# Patient Record
Sex: Female | Born: 1985 | Race: White | Hispanic: No | Marital: Single | Smoking: Never smoker
Health system: Southern US, Community
[De-identification: ages and names within clinical notes are randomized; demographics above are authoritative.]

## PROBLEM LIST (undated history)

## (undated) DIAGNOSIS — G43909 Migraine, unspecified, not intractable, without status migrainosus: Secondary | ICD-10-CM

## (undated) DIAGNOSIS — T7840XA Allergy, unspecified, initial encounter: Secondary | ICD-10-CM

## (undated) HISTORY — DX: Migraine, unspecified, not intractable, without status migrainosus: G43.909

## (undated) HISTORY — PX: DENTAL SURGERY: SHX609

## (undated) HISTORY — PX: KNEE SURGERY: SHX244

## (undated) HISTORY — DX: Allergy, unspecified, initial encounter: T78.40XA

---
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
## 2003-03-11 ENCOUNTER — Emergency Department (HOSPITAL_COMMUNITY): Admission: AD | Admit: 2003-03-11 | Discharge: 2003-03-11 | Payer: Self-pay | Admitting: Family Medicine

## 2003-03-15 ENCOUNTER — Emergency Department (HOSPITAL_COMMUNITY): Admission: AD | Admit: 2003-03-15 | Discharge: 2003-03-15 | Payer: Self-pay | Admitting: Family Medicine

## 2003-03-16 ENCOUNTER — Ambulatory Visit (HOSPITAL_COMMUNITY): Admission: RE | Admit: 2003-03-16 | Discharge: 2003-03-16 | Payer: Self-pay | Admitting: Family Medicine

## 2003-03-21 ENCOUNTER — Emergency Department (HOSPITAL_COMMUNITY): Admission: AD | Admit: 2003-03-21 | Discharge: 2003-03-21 | Payer: Self-pay | Admitting: Family Medicine

## 2003-03-29 ENCOUNTER — Encounter: Admission: RE | Admit: 2003-03-29 | Discharge: 2003-03-29 | Payer: Self-pay | Admitting: Gastroenterology

## 2003-05-15 ENCOUNTER — Emergency Department (HOSPITAL_COMMUNITY): Admission: EM | Admit: 2003-05-15 | Discharge: 2003-05-15 | Payer: Self-pay | Admitting: Emergency Medicine

## 2003-06-21 ENCOUNTER — Emergency Department (HOSPITAL_COMMUNITY): Admission: EM | Admit: 2003-06-21 | Discharge: 2003-06-22 | Payer: Self-pay | Admitting: Emergency Medicine

## 2004-03-24 ENCOUNTER — Emergency Department (HOSPITAL_COMMUNITY): Admission: EM | Admit: 2004-03-24 | Discharge: 2004-03-24 | Payer: Self-pay | Admitting: Family Medicine

## 2005-05-12 ENCOUNTER — Emergency Department (HOSPITAL_COMMUNITY): Admission: AD | Admit: 2005-05-12 | Discharge: 2005-05-12 | Payer: Self-pay | Admitting: Family Medicine

## 2005-06-03 ENCOUNTER — Emergency Department (HOSPITAL_COMMUNITY): Admission: EM | Admit: 2005-06-03 | Discharge: 2005-06-03 | Payer: Self-pay | Admitting: Family Medicine

## 2013-05-20 ENCOUNTER — Ambulatory Visit (INDEPENDENT_AMBULATORY_CARE_PROVIDER_SITE_OTHER): Payer: BC Managed Care – PPO | Admitting: Physician Assistant

## 2013-05-20 VITALS — BP 110/72 | HR 79 | Temp 98.0°F | Resp 18 | Ht 70.0 in | Wt 181.0 lb

## 2013-05-20 DIAGNOSIS — B9789 Other viral agents as the cause of diseases classified elsewhere: Principal | ICD-10-CM

## 2013-05-20 DIAGNOSIS — J069 Acute upper respiratory infection, unspecified: Secondary | ICD-10-CM

## 2013-05-20 MED ORDER — BENZONATATE 100 MG PO CAPS: 100.0000 mg | ORAL_CAPSULE | Freq: Three times a day (TID) | ORAL | Status: DC | PRN

## 2013-05-20 MED ORDER — IPRATROPIUM BROMIDE 0.03 % NA SOLN: 2.0000 | Freq: Two times a day (BID) | NASAL | Status: DC

## 2013-05-20 MED ORDER — HYDROCODONE-HOMATROPINE 5-1.5 MG/5ML PO SYRP: 5.0000 mL | ORAL_SOLUTION | Freq: Three times a day (TID) | ORAL | Status: DC | PRN

## 2013-05-20 MED ORDER — CETIRIZINE HCL 10 MG PO TABS: 10.0000 mg | ORAL_TABLET | Freq: Every day | ORAL | Status: DC

## 2013-05-20 MED ORDER — FIRST-DUKES MOUTHWASH MT SUSP
10.0000 mL | OROMUCOSAL | Status: DC | PRN
Start: 2013-05-20 — End: 2013-06-29

## 2013-05-20 NOTE — Progress Notes (Signed)
   Subjective:    Patient ID: Magdalene RiverEmmalee D Zobrist, female    DOB: 1985-09-05, 28 y.o.   MRN: [id number]  HPI   Ms. Langston MaskerMorris is a very pleasant 28 yr old female here with concern for illness.  Reports began feeling ill 4 days ago.  Symptoms include sore throat, laryngitis, "throat on fire," coughing constantly.  Cough keeps awake at night.  Nose isn't too congested but she has lots of PND.  Ears feel clogged.  Appetite is down.  NO fever.  No SOB or wheezing.  No known sick contacts but was at a conference last week.  Taking Dayquil and Mucinex without relief.     Review of Systems  Constitutional: Positive for appetite change (decreased). Negative for fever and chills.  HENT: Positive for congestion, ear pain (fullness), postnasal drip and sore throat.   Respiratory: Positive for cough. Negative for shortness of breath and wheezing.   Cardiovascular: Negative.   Gastrointestinal: Negative.   Musculoskeletal: Negative.   Skin: Negative.        Objective:   Physical Exam  Vitals reviewed. Constitutional: She is oriented to person, place, and time. She appears well-developed and well-nourished. No distress.  HENT:  Head: Normocephalic and atraumatic.  Right Ear: Tympanic membrane and ear canal normal.  Left Ear: Tympanic membrane and ear canal normal.  Nose: Mucosal edema and rhinorrhea present. Right sinus exhibits no maxillary sinus tenderness and no frontal sinus tenderness. Left sinus exhibits no maxillary sinus tenderness and no frontal sinus tenderness.  Mouth/Throat: Uvula is midline, oropharynx is clear and moist and mucous membranes are normal.  Eyes: Conjunctivae are normal. No scleral icterus.  Neck: Neck supple.  Cardiovascular: Normal rate, regular rhythm and normal heart sounds.   Pulmonary/Chest: Effort normal and breath sounds normal. She has no wheezes. She has no rales.  Abdominal: Soft. There is no tenderness.  Lymphadenopathy:    She has cervical adenopathy.    Neurological: She is alert and oriented to person, place, and time.  Skin: Skin is warm and dry.  Psychiatric: She has a normal mood and affect. Her behavior is normal.       Assessment & Plan:  Viral URI with cough - Plan: Diphenhyd-Hydrocort-Nystatin (FIRST-DUKES MOUTHWASH) SUSP, cetirizine (ZYRTEC) 10 MG tablet, benzonatate (TESSALON) 100 MG capsule, HYDROcodone-homatropine (HYCODAN) 5-1.5 MG/5ML syrup, ipratropium (ATROVENT) 0.03 % nasal spray   Ms. Langston MaskerMorris is a very pleasant 28 yr old female here with 4 days of URI symptoms.  Afebrile, VSS, lungs CTA, throat clear.  Suspect viral etiology.  Will treat symptomatically with Zyrtec, Atrovent, Tessalon, Hycodan, Duke's.  Push fluids, rest.  Work note provided.  Discussed RTC precautions including fever, SOB, worsening cough  Pt to call or RTC if worsening or not improving  E. Frances FurbishElizabeth Deondre Marinaro MHS, PA-C [facility] 3/26/20155:35 PM

## 2013-05-20 NOTE — Patient Instructions (Signed)
Take the benzonatate (Tessalon) every 8 hours as needed for cough  Use the Hycodan syrup as needed for cough - may make you sleepy, so try at night first  Take the cetirizine (Zyrtec) once daily - this will help dry up congestion and post-nasal drainage.  The ipratroprium (Atrovent) nasal spray will also help with this - use this 2-3 times per day  Magic Mouthwash as frequently as every 2 hours if needed for throat pain  Plenty of fluids (water is best!) and rest  Please let us know if any symptoms are worsening or not improving   Upper Respiratory Infection, Adult An upper respiratory infection (URI) is also sometimes known as the common cold. The upper respiratory tract includes the nose, sinuses, throat, trachea, and bronchi. Bronchi are the airways leading to the lungs. Most people improve within 1 week, but symptoms can last up to 2 weeks. A residual cough may last even longer.  CAUSES Many different viruses can infect the tissues lining the upper respiratory tract. The tissues become irritated and inflamed and often become very moist. Mucus production is also common. A cold is contagious. You can easily spread the virus to others by oral contact. This includes kissing, sharing a glass, coughing, or sneezing. Touching your mouth or nose and then touching a surface, which is then touched by another person, can also spread the virus. SYMPTOMS  Symptoms typically develop 1 to 3 days after you come in contact with a cold virus. Symptoms vary from person to person. They may include:  Runny nose.  Sneezing.  Nasal congestion.  Sinus irritation.  Sore throat.  Loss of voice (laryngitis).  Cough.  Fatigue.  Muscle aches.  Loss of appetite.  Headache.  Low-grade fever. DIAGNOSIS  You might diagnose your own cold based on familiar symptoms, since most people get a cold 2 to 3 times a year. Your caregiver can confirm this based on your exam. Most importantly, your caregiver can  check that your symptoms are not due to another disease such as strep throat, sinusitis, pneumonia, asthma, or epiglottitis. Blood tests, throat tests, and X-rays are not necessary to diagnose a common cold, but they may sometimes be helpful in excluding other more serious diseases. Your caregiver will decide if any further tests are required. RISKS AND COMPLICATIONS  You may be at risk for a more severe case of the common cold if you smoke cigarettes, have chronic heart disease (such as heart failure) or lung disease (such as asthma), or if you have a weakened immune system. The very young and very old are also at risk for more serious infections. Bacterial sinusitis, middle ear infections, and bacterial pneumonia can complicate the common cold. The common cold can worsen asthma and chronic obstructive pulmonary disease (COPD). Sometimes, these complications can require emergency medical care and may be life-threatening. PREVENTION  The best way to protect against getting a cold is to practice good hygiene. Avoid oral or hand contact with people with cold symptoms. Wash your hands often if contact occurs. There is no clear evidence that vitamin C, vitamin E, echinacea, or exercise reduces the chance of developing a cold. However, it is always recommended to get plenty of rest and practice good nutrition. TREATMENT  Treatment is directed at relieving symptoms. There is no cure. Antibiotics are not effective, because the infection is caused by a virus, not by bacteria. Treatment may include:  Increased fluid intake. Sports drinks offer valuable electrolytes, sugars, and fluids.  Breathing heated  mist or steam (vaporizer or shower).  Eating chicken soup or other clear broths, and maintaining good nutrition.  Getting plenty of rest.  Using gargles or lozenges for comfort.  Controlling fevers with ibuprofen or acetaminophen as directed by your caregiver.  Increasing usage of your inhaler if you have  asthma. Zinc gel and zinc lozenges, taken in the first 24 hours of the common cold, can shorten the duration and lessen the severity of symptoms. Pain medicines may help with fever, muscle aches, and throat pain. A variety of non-prescription medicines are available to treat congestion and runny nose. Your caregiver can make recommendations and may suggest nasal or lung inhalers for other symptoms.  HOME CARE INSTRUCTIONS   Only take over-the-counter or prescription medicines for pain, discomfort, or fever as directed by your caregiver.  Use a warm mist humidifier or inhale steam from a shower to increase air moisture. This may keep secretions moist and make it easier to breathe.  Drink enough water and fluids to keep your urine clear or pale yellow.  Rest as needed.  Return to work when your temperature has returned to normal or as your caregiver advises. You may need to stay home longer to avoid infecting others. You can also use a face mask and careful hand washing to prevent spread of the virus. SEEK MEDICAL CARE IF:   After the first few days, you feel you are getting worse rather than better.  You need your caregiver's advice about medicines to control symptoms.  You develop chills, worsening shortness of breath, or brown or red sputum. These may be signs of pneumonia.  You develop yellow or brown nasal discharge or pain in the face, especially when you bend forward. These may be signs of sinusitis.  You develop a fever, swollen neck glands, pain with swallowing, or white areas in the back of your throat. These may be signs of strep throat. SEEK IMMEDIATE MEDICAL CARE IF:   You have a fever.  You develop severe or persistent headache, ear pain, sinus pain, or chest pain.  You develop wheezing, a prolonged cough, cough up blood, or have a change in your usual mucus (if you have chronic lung disease).  You develop sore muscles or a stiff neck. Document Released: 08/07/2000  Document Revised: 05/06/2011 Document Reviewed: 06/15/2010 [facility] Patient Information 2014 [place].

## 2013-06-29 ENCOUNTER — Emergency Department (HOSPITAL_COMMUNITY): Payer: BC Managed Care – PPO

## 2013-06-29 ENCOUNTER — Emergency Department (HOSPITAL_COMMUNITY)
Admission: EM | Admit: 2013-06-29 | Discharge: 2013-06-29 | Disposition: A | Discharge status: Home / Self Care | Payer: BC Managed Care – PPO | Attending: Emergency Medicine | Admitting: Emergency Medicine

## 2013-06-29 ENCOUNTER — Encounter (HOSPITAL_COMMUNITY): Payer: Self-pay | Admitting: Emergency Medicine

## 2013-06-29 DIAGNOSIS — Z79899 Other long term (current) drug therapy: Secondary | ICD-10-CM | POA: Insufficient documentation

## 2013-06-29 DIAGNOSIS — J209 Acute bronchitis, unspecified: Secondary | ICD-10-CM | POA: Insufficient documentation

## 2013-06-29 DIAGNOSIS — Z880 Allergy status to penicillin: Secondary | ICD-10-CM | POA: Insufficient documentation

## 2013-06-29 DIAGNOSIS — J40 Bronchitis, not specified as acute or chronic: Secondary | ICD-10-CM

## 2013-06-29 DIAGNOSIS — Z8679 Personal history of other diseases of the circulatory system: Secondary | ICD-10-CM | POA: Insufficient documentation

## 2013-06-29 MED ORDER — LEVOFLOXACIN 500 MG PO TABS: 500.0000 mg | ORAL_TABLET | Freq: Every day | ORAL | Status: DC

## 2013-06-29 MED ORDER — ACETAMINOPHEN 325 MG PO TABS
650.0000 mg | ORAL_TABLET | Freq: Once | ORAL | Status: AC
  Administered 2013-06-29: 650 mg via ORAL
  Filled 2013-06-29: qty 2

## 2013-06-29 MED ORDER — IBUPROFEN 800 MG PO TABS: 800.0000 mg | ORAL_TABLET | Freq: Three times a day (TID) | ORAL | Status: DC | PRN

## 2013-06-29 MED ORDER — SODIUM CHLORIDE 0.9 % IV BOLUS (SEPSIS)
1000.0000 mL | Freq: Once | INTRAVENOUS | Status: AC
  Administered 2013-06-29: 1000 mL via INTRAVENOUS

## 2013-06-29 NOTE — Discharge Instructions (Signed)
Follow up in 2 days if not improving.  Drink plenty of fluids.

## 2013-06-29 NOTE — ED Provider Notes (Signed)
CSN: [id number]     Arrival date & time 06/29/13  2012 History   First MD Initiated Contact with Patient 06/29/13 2055     Chief Complaint  Patient presents with  . Fever     (Consider location/radiation/quality/duration/timing/severity/associated sxs/prior Treatment) Patient is a 28 y.o. female presenting with cough. The history is provided by the patient (the pt complains of cough).  Cough Cough characteristics:  Non-productive Severity:  Moderate Onset quality:  Sudden Timing:  Constant Progression:  Unchanged Chronicity:  New Context: not animal exposure   Associated symptoms: no chest pain, no eye discharge, no headaches and no rash     Past Medical History  Diagnosis Date  . Allergy   . Migraine    Past Surgical History  Procedure Laterality Date  . Knee surgery    . Dental surgery     Family History  Problem Relation Age of Onset  . Hypertension Father   . Cancer Paternal Grandmother    History  Substance Use Topics  . Smoking status: Never Smoker   . Smokeless tobacco: Not on file  . Alcohol Use: Yes   OB History   Grav Para Term Preterm Abortions TAB SAB Ect Mult Living                 Review of Systems  Constitutional: Negative for appetite change and fatigue.  HENT: Negative for congestion, ear discharge and sinus pressure.   Eyes: Negative for discharge.  Respiratory: Positive for cough.   Cardiovascular: Negative for chest pain.  Gastrointestinal: Negative for abdominal pain and diarrhea.  Genitourinary: Negative for frequency and hematuria.  Musculoskeletal: Negative for back pain.  Skin: Negative for rash.  Neurological: Negative for seizures and headaches.  Psychiatric/Behavioral: Negative for hallucinations.      Allergies  Ceclor; Cleocin; Other; Penicillins; and Zinc  Home Medications   Prior to Admission medications   Medication Sig Start Date End Date Taking? Authorizing Provider  acetaminophen (TYLENOL) 500 MG tablet Take 500  mg by mouth every 6 (six) hours as needed for fever.   Yes Historical Provider, MD  cetirizine (ZYRTEC) 10 MG tablet Take 1 tablet (10 mg total) by mouth daily. 05/20/13  Yes Eleanore E Debbra RidingEgan, PA-C  guaiFENesin (MUCINEX) 600 MG 12 hr tablet Take 600 mg by mouth 2 (two) times daily as needed for cough or to loosen phlegm.   Yes Historical Provider, MD  ibuprofen (ADVIL,MOTRIN) 200 MG tablet Take 400 mg by mouth every 6 (six) hours as needed for fever or moderate pain.   Yes Historical Provider, MD  Levonorgestrel-Ethinyl Estradiol (LOSEASONIQUE) 0.1-0.02 & 0.01 MG tablet Take 1 tablet by mouth daily.   Yes Historical Provider, MD  ibuprofen (ADVIL,MOTRIN) 800 MG tablet Take 1 tablet (800 mg total) by mouth every 8 (eight) hours as needed for fever or moderate pain. 06/29/13   Benny LennertJoseph L Jhordan Kinter, MD  levofloxacin (LEVAQUIN) 500 MG tablet Take 1 tablet (500 mg total) by mouth daily. 06/29/13   Benny LennertJoseph L Steele Ledonne, MD   BP 144/97  Pulse 122  Temp(Src) 100.6 F (38.1 C) (Oral)  SpO2 96% Physical Exam  Constitutional: She is oriented to person, place, and time. She appears well-developed.  HENT:  Head: Normocephalic.  Eyes: Conjunctivae and EOM are normal. No scleral icterus.  Neck: Neck supple. No thyromegaly present.  Cardiovascular: Normal rate and regular rhythm.  Exam reveals no gallop and no friction rub.   No murmur heard. Pulmonary/Chest: No stridor. She has no wheezes. She  has no rales. She exhibits no tenderness.  Abdominal: She exhibits no distension. There is no tenderness. There is no rebound.  Musculoskeletal: Normal range of motion. She exhibits no edema.  Lymphadenopathy:    She has no cervical adenopathy.  Neurological: She is oriented to person, place, and time. She exhibits normal muscle tone. Coordination normal.  Skin: No rash noted. No erythema.  Psychiatric: She has a normal mood and affect. Her behavior is normal.    ED Course  Procedures (including critical care time) Labs  Review Labs Reviewed - No data to display  Imaging Review Dg Chest 2 View  06/29/2013   CLINICAL DATA:  Fever, wheezing, productive cough  EXAM: CHEST  2 VIEW  COMPARISON:  None.  FINDINGS: Cardiomediastinal silhouette is unremarkable. No acute infiltrate or pleural effusion. No pulmonary edema. Bony thorax is unremarkable.  IMPRESSION: No active cardiopulmonary disease.   Electronically Signed   By: Natasha MeadLiviu  Pop M.D.   On: 06/29/2013 21:44     EKG Interpretation None      MDM   Final diagnoses:  Bronchitis    The chart was scribed for me under my direct supervision.  I personally performed the history, physical, and medical decision making and all procedures in the evaluation of this patient.Benny Lennert.    Roxanna Mcever L Cameo Schmiesing, MD 06/29/13 2230

## 2013-06-29 NOTE — ED Notes (Signed)
Pt states she has had cough, congestion, body aches and fever on and off since Friday/Saturday. Has been taking tylenol ibuprofen at home. Took ibuprofen last at 630pm. Alert and oriented.

## 2013-06-29 NOTE — Progress Notes (Signed)
  CARE MANAGEMENT ED NOTE 06/29/2013  Patient:  Angela Joseph,Angela Joseph   Account Number:  [id number]  Date Initiated:  06/29/2013  Documentation initiated by:  Radford PaxFERRERO,Sachit Gilman  Subjective/Objective Assessment:   Patient presents to Ed with cough, congestion and body aches     Subjective/Objective Assessment Detail:     Action/Plan:   Action/Plan Detail:   Anticipated DC Date:  06/29/2013     Status Recommendation to Physician:   Result of Recommendation:    Other ED Services  Consult Working Plan    DC Planning Services  Other  PCP issues    Choice offered to / List presented to:            Status of service:  Completed, signed off  ED Comments:   ED Comments Detail:  Patient confirms she does not have a pcp.  EDCM instructed patient to call the phon enumber on the back of her insurance card or go to [employer] website to help her find a pcp who is close to her and within network. Patient verbalized understanding.  No further EDCM needs at this time.

## 2015-05-31 DIAGNOSIS — M542 Cervicalgia: Secondary | ICD-10-CM | POA: Diagnosis not present

## 2015-05-31 DIAGNOSIS — M9901 Segmental and somatic dysfunction of cervical region: Secondary | ICD-10-CM | POA: Diagnosis not present

## 2015-05-31 DIAGNOSIS — M9902 Segmental and somatic dysfunction of thoracic region: Secondary | ICD-10-CM | POA: Diagnosis not present

## 2015-05-31 DIAGNOSIS — M9903 Segmental and somatic dysfunction of lumbar region: Secondary | ICD-10-CM | POA: Diagnosis not present

## 2015-06-28 DIAGNOSIS — M9902 Segmental and somatic dysfunction of thoracic region: Secondary | ICD-10-CM | POA: Diagnosis not present

## 2015-06-28 DIAGNOSIS — M9903 Segmental and somatic dysfunction of lumbar region: Secondary | ICD-10-CM | POA: Diagnosis not present

## 2015-06-28 DIAGNOSIS — M9901 Segmental and somatic dysfunction of cervical region: Secondary | ICD-10-CM | POA: Diagnosis not present

## 2015-06-28 DIAGNOSIS — M542 Cervicalgia: Secondary | ICD-10-CM | POA: Diagnosis not present

## 2015-07-26 DIAGNOSIS — M9901 Segmental and somatic dysfunction of cervical region: Secondary | ICD-10-CM | POA: Diagnosis not present

## 2015-07-26 DIAGNOSIS — M9902 Segmental and somatic dysfunction of thoracic region: Secondary | ICD-10-CM | POA: Diagnosis not present

## 2015-07-26 DIAGNOSIS — M542 Cervicalgia: Secondary | ICD-10-CM | POA: Diagnosis not present

## 2015-07-26 DIAGNOSIS — M9903 Segmental and somatic dysfunction of lumbar region: Secondary | ICD-10-CM | POA: Diagnosis not present

## 2015-08-16 DIAGNOSIS — M9902 Segmental and somatic dysfunction of thoracic region: Secondary | ICD-10-CM | POA: Diagnosis not present

## 2015-08-16 DIAGNOSIS — M9903 Segmental and somatic dysfunction of lumbar region: Secondary | ICD-10-CM | POA: Diagnosis not present

## 2015-08-16 DIAGNOSIS — M542 Cervicalgia: Secondary | ICD-10-CM | POA: Diagnosis not present

## 2015-08-16 DIAGNOSIS — M9901 Segmental and somatic dysfunction of cervical region: Secondary | ICD-10-CM | POA: Diagnosis not present

## 2015-09-13 DIAGNOSIS — M9901 Segmental and somatic dysfunction of cervical region: Secondary | ICD-10-CM | POA: Diagnosis not present

## 2015-09-13 DIAGNOSIS — M542 Cervicalgia: Secondary | ICD-10-CM | POA: Diagnosis not present

## 2015-09-13 DIAGNOSIS — M9902 Segmental and somatic dysfunction of thoracic region: Secondary | ICD-10-CM | POA: Diagnosis not present

## 2015-09-13 DIAGNOSIS — M9903 Segmental and somatic dysfunction of lumbar region: Secondary | ICD-10-CM | POA: Diagnosis not present

## 2015-10-11 DIAGNOSIS — M9903 Segmental and somatic dysfunction of lumbar region: Secondary | ICD-10-CM | POA: Diagnosis not present

## 2015-10-11 DIAGNOSIS — M9902 Segmental and somatic dysfunction of thoracic region: Secondary | ICD-10-CM | POA: Diagnosis not present

## 2015-10-11 DIAGNOSIS — M9901 Segmental and somatic dysfunction of cervical region: Secondary | ICD-10-CM | POA: Diagnosis not present

## 2015-10-11 DIAGNOSIS — M542 Cervicalgia: Secondary | ICD-10-CM | POA: Diagnosis not present

## 2015-11-08 DIAGNOSIS — M542 Cervicalgia: Secondary | ICD-10-CM | POA: Diagnosis not present

## 2015-11-08 DIAGNOSIS — M9902 Segmental and somatic dysfunction of thoracic region: Secondary | ICD-10-CM | POA: Diagnosis not present

## 2015-11-08 DIAGNOSIS — M9901 Segmental and somatic dysfunction of cervical region: Secondary | ICD-10-CM | POA: Diagnosis not present

## 2015-11-08 DIAGNOSIS — M9903 Segmental and somatic dysfunction of lumbar region: Secondary | ICD-10-CM | POA: Diagnosis not present

## 2015-12-06 DIAGNOSIS — M9903 Segmental and somatic dysfunction of lumbar region: Secondary | ICD-10-CM | POA: Diagnosis not present

## 2015-12-06 DIAGNOSIS — M9901 Segmental and somatic dysfunction of cervical region: Secondary | ICD-10-CM | POA: Diagnosis not present

## 2015-12-06 DIAGNOSIS — M9902 Segmental and somatic dysfunction of thoracic region: Secondary | ICD-10-CM | POA: Diagnosis not present

## 2015-12-06 DIAGNOSIS — M542 Cervicalgia: Secondary | ICD-10-CM | POA: Diagnosis not present

## 2016-01-03 DIAGNOSIS — M9901 Segmental and somatic dysfunction of cervical region: Secondary | ICD-10-CM | POA: Diagnosis not present

## 2016-01-03 DIAGNOSIS — M9902 Segmental and somatic dysfunction of thoracic region: Secondary | ICD-10-CM | POA: Diagnosis not present

## 2016-01-03 DIAGNOSIS — M9903 Segmental and somatic dysfunction of lumbar region: Secondary | ICD-10-CM | POA: Diagnosis not present

## 2016-01-03 DIAGNOSIS — M542 Cervicalgia: Secondary | ICD-10-CM | POA: Diagnosis not present

## 2016-01-31 DIAGNOSIS — M542 Cervicalgia: Secondary | ICD-10-CM | POA: Diagnosis not present

## 2016-01-31 DIAGNOSIS — M9901 Segmental and somatic dysfunction of cervical region: Secondary | ICD-10-CM | POA: Diagnosis not present

## 2016-01-31 DIAGNOSIS — M9903 Segmental and somatic dysfunction of lumbar region: Secondary | ICD-10-CM | POA: Diagnosis not present

## 2016-01-31 DIAGNOSIS — M9902 Segmental and somatic dysfunction of thoracic region: Secondary | ICD-10-CM | POA: Diagnosis not present

## 2016-02-28 DIAGNOSIS — M542 Cervicalgia: Secondary | ICD-10-CM | POA: Diagnosis not present

## 2016-02-28 DIAGNOSIS — M9903 Segmental and somatic dysfunction of lumbar region: Secondary | ICD-10-CM | POA: Diagnosis not present

## 2016-02-28 DIAGNOSIS — M9901 Segmental and somatic dysfunction of cervical region: Secondary | ICD-10-CM | POA: Diagnosis not present

## 2016-02-28 DIAGNOSIS — M9902 Segmental and somatic dysfunction of thoracic region: Secondary | ICD-10-CM | POA: Diagnosis not present

## 2016-03-07 DIAGNOSIS — M9901 Segmental and somatic dysfunction of cervical region: Secondary | ICD-10-CM | POA: Diagnosis not present

## 2016-03-07 DIAGNOSIS — M9903 Segmental and somatic dysfunction of lumbar region: Secondary | ICD-10-CM | POA: Diagnosis not present

## 2016-03-07 DIAGNOSIS — M9902 Segmental and somatic dysfunction of thoracic region: Secondary | ICD-10-CM | POA: Diagnosis not present

## 2016-03-07 DIAGNOSIS — M542 Cervicalgia: Secondary | ICD-10-CM | POA: Diagnosis not present

## 2016-03-18 DIAGNOSIS — M9903 Segmental and somatic dysfunction of lumbar region: Secondary | ICD-10-CM | POA: Diagnosis not present

## 2016-03-18 DIAGNOSIS — M542 Cervicalgia: Secondary | ICD-10-CM | POA: Diagnosis not present

## 2016-03-18 DIAGNOSIS — M9901 Segmental and somatic dysfunction of cervical region: Secondary | ICD-10-CM | POA: Diagnosis not present

## 2016-03-18 DIAGNOSIS — M9902 Segmental and somatic dysfunction of thoracic region: Secondary | ICD-10-CM | POA: Diagnosis not present

## 2016-04-02 DIAGNOSIS — M542 Cervicalgia: Secondary | ICD-10-CM | POA: Diagnosis not present

## 2016-04-02 DIAGNOSIS — M9901 Segmental and somatic dysfunction of cervical region: Secondary | ICD-10-CM | POA: Diagnosis not present

## 2016-04-02 DIAGNOSIS — M9902 Segmental and somatic dysfunction of thoracic region: Secondary | ICD-10-CM | POA: Diagnosis not present

## 2016-04-02 DIAGNOSIS — M9903 Segmental and somatic dysfunction of lumbar region: Secondary | ICD-10-CM | POA: Diagnosis not present

## 2016-04-24 DIAGNOSIS — M9901 Segmental and somatic dysfunction of cervical region: Secondary | ICD-10-CM | POA: Diagnosis not present

## 2016-04-24 DIAGNOSIS — M9903 Segmental and somatic dysfunction of lumbar region: Secondary | ICD-10-CM | POA: Diagnosis not present

## 2016-04-24 DIAGNOSIS — M9902 Segmental and somatic dysfunction of thoracic region: Secondary | ICD-10-CM | POA: Diagnosis not present

## 2016-04-24 DIAGNOSIS — M542 Cervicalgia: Secondary | ICD-10-CM | POA: Diagnosis not present

## 2016-05-17 DIAGNOSIS — Z1329 Encounter for screening for other suspected endocrine disorder: Secondary | ICD-10-CM | POA: Diagnosis not present

## 2016-05-17 DIAGNOSIS — Z13220 Encounter for screening for lipoid disorders: Secondary | ICD-10-CM | POA: Diagnosis not present

## 2016-05-17 DIAGNOSIS — R87612 Low grade squamous intraepithelial lesion on cytologic smear of cervix (LGSIL): Secondary | ICD-10-CM | POA: Diagnosis not present

## 2016-05-17 DIAGNOSIS — Z0000 Encounter for general adult medical examination without abnormal findings: Secondary | ICD-10-CM | POA: Diagnosis not present

## 2016-05-17 DIAGNOSIS — Z1151 Encounter for screening for human papillomavirus (HPV): Secondary | ICD-10-CM | POA: Diagnosis not present

## 2016-05-17 DIAGNOSIS — Z130 Encounter for screening for diseases of the blood and blood-forming organs and certain disorders involving the immune mechanism: Secondary | ICD-10-CM | POA: Diagnosis not present

## 2016-05-17 DIAGNOSIS — Z6830 Body mass index (BMI) 30.0-30.9, adult: Secondary | ICD-10-CM | POA: Diagnosis not present

## 2016-05-17 DIAGNOSIS — Z1389 Encounter for screening for other disorder: Secondary | ICD-10-CM | POA: Diagnosis not present

## 2016-05-17 DIAGNOSIS — Z01419 Encounter for gynecological examination (general) (routine) without abnormal findings: Secondary | ICD-10-CM | POA: Diagnosis not present

## 2016-05-22 DIAGNOSIS — M9901 Segmental and somatic dysfunction of cervical region: Secondary | ICD-10-CM | POA: Diagnosis not present

## 2016-05-22 DIAGNOSIS — M9903 Segmental and somatic dysfunction of lumbar region: Secondary | ICD-10-CM | POA: Diagnosis not present

## 2016-05-22 DIAGNOSIS — M542 Cervicalgia: Secondary | ICD-10-CM | POA: Diagnosis not present

## 2016-05-22 DIAGNOSIS — M9902 Segmental and somatic dysfunction of thoracic region: Secondary | ICD-10-CM | POA: Diagnosis not present

## 2016-06-12 ENCOUNTER — Ambulatory Visit (INDEPENDENT_AMBULATORY_CARE_PROVIDER_SITE_OTHER): Payer: BLUE CROSS/BLUE SHIELD | Admitting: Obstetrics & Gynecology

## 2016-06-12 ENCOUNTER — Encounter: Payer: Self-pay | Admitting: Obstetrics & Gynecology

## 2016-06-12 VITALS — BP 132/86

## 2016-06-12 DIAGNOSIS — R87612 Low grade squamous intraepithelial lesion on cytologic smear of cervix (LGSIL): Secondary | ICD-10-CM | POA: Diagnosis not present

## 2016-06-12 DIAGNOSIS — R7989 Other specified abnormal findings of blood chemistry: Secondary | ICD-10-CM

## 2016-06-12 DIAGNOSIS — E559 Vitamin D deficiency, unspecified: Secondary | ICD-10-CM | POA: Diagnosis not present

## 2016-06-12 DIAGNOSIS — N870 Mild cervical dysplasia: Secondary | ICD-10-CM | POA: Diagnosis not present

## 2016-06-12 NOTE — Progress Notes (Signed)
    Angela Joseph Oct 05, 1985 [id number]        31 y.o.  G0 single.  Last IC 12/2015.  On Seasonique.  Presenting for Colpo d/t LGSIL on Pap 04/2016.  Previous pap in 2016 was normal with neg HR HPV.  Low Vit D.  Obesity.  Exercising, but would like counseling on Nutrition.  Past medical history,surgical history, problem list, medications, allergies, family history and social history were all reviewed and documented in the EPIC chart.  Directed ROS with pertinent positives and negatives documented in the history of present illness/assessment and plan.  Exam:  Vitals:   06/12/16 1042  BP: 132/86   General appearance:  Normal  Colposcopy Procedure Note Angela Joseph 06/12/2016  Indications:  LGSIL  Procedure Details  The risks and benefits of the procedure and Verbal informed consent obtained.  Speculum placed in vagina and excellent visualization of cervix achieved, cervix swabbed x 3 with acetic acid solution.  Findings:  Cervix colposcopy:  Physical Exam  Genitourinary:      Vaginal colposcopy:  Normal   Vulvar colposcopy:  Not done, grossely normal.  Perirectal colposcopy:  Not done, grossely normal.  Specimens: Cervical Bxs at 12 and 3 O'clock  Complications:  None .  Plan:  Pending Bxs and HR HPV.  Management per results.  Assessment/Plan:  31 y.o.   1. LGSIL on Pap smear of cervix  - Pathology Report Cervical Bxs at 12 and 3 O'clock - HPV Type 16 and 18/45 RNA  Folic Acid supplement recommended. Management per results.  2. Morbid obesity (HCC) Continue Physical activity.  Referral to [facility].  3. Low vitamin D level Vit D supplement 800 IU qd recommended.  Counseling >50% x 15 min on above problems.  [name] MD, 10:50 AM 06/12/2016

## 2016-06-12 NOTE — Patient Instructions (Addendum)
Your Pap showed LGSIL.  A Colposcopy was done today with a Cervical biopsy and HPV HR testing.  I will let you know the results as soon as available.  It is normal to have mild spotting and cramping for a few days.  A supplement of Folic Acid is recommended.  We also addressed your low Vit D at 11.5.  I recommend a supplement of Vit D 800 IU per day.  Obesity was also discussed and a referral to a Nutritionist is being organized for you.

## 2016-06-13 ENCOUNTER — Telehealth: Payer: Self-pay | Admitting: *Deleted

## 2016-06-13 LAB — HPV TYPE 16 AND 18/45 RNA
HPV TYPE 16 RNA: NOT DETECTED
HPV Type 18/45 RNA: NOT DETECTED

## 2016-06-13 NOTE — Telephone Encounter (Signed)
-----   Message from [name], MD sent at 06/12/2016 11:27 AM EDT ----- Refer to [facility].  Obesity.

## 2016-06-13 NOTE — Telephone Encounter (Signed)
[facility] will contact [facility] to schedule.

## 2016-06-14 LAB — PATHOLOGY

## 2016-06-18 NOTE — Telephone Encounter (Signed)
Appointment on 07/03/16

## 2016-06-19 ENCOUNTER — Ambulatory Visit (INDEPENDENT_AMBULATORY_CARE_PROVIDER_SITE_OTHER): Payer: BLUE CROSS/BLUE SHIELD | Admitting: Obstetrics & Gynecology

## 2016-06-19 ENCOUNTER — Encounter: Payer: Self-pay | Admitting: Obstetrics & Gynecology

## 2016-06-19 VITALS — BP 134/88

## 2016-06-19 DIAGNOSIS — N870 Mild cervical dysplasia: Secondary | ICD-10-CM

## 2016-06-19 NOTE — Patient Instructions (Signed)
We discussed your Colposcopy results today.  The cervical biopsy showed CIN 1 (mild dysplasia).  At the center of one of the cervical biopsy, an area bordering CIN 2 (moderate dysplasia) was present.  The more aggressive HPV viruses 16-18 and 45 were not detected.  A LEEP treatment versus a repeat Colposcopy in 4 months were discussed with you.  Given that CIN 1 regresses in the vast majority of cases and that a LEEP could cause your cervix to be weaker for an eventual pregnancy, the decision was taken to proceed with a repeat Colposcopy in 4 months.  Supplements of Folic Acid are recommended to help your immune system fight and get you rid of the cervical dysplasia with time.  Condoms are strongly recommended. See you back in 4 months for a Colposcopy.

## 2016-06-19 NOTE — Progress Notes (Signed)
    Angela Joseph April 30, 1985 [id number]        31 y.o.  G0 single  F/U post Colpo to discuss results  Past medical history,surgical history, problem list, medications, allergies, family history and social history were all reviewed and documented in the EPIC chart.  Directed ROS with pertinent positives and negatives documented in the history of present illness/assessment and plan.  Exam:  Vitals:   06/19/16 1034  BP: 134/88   General appearance:  Normal  Patho: Cervix- Biopsy, 3 o'clock:     Cervical transformation zone with low grade squamous intraepithelial lesion (LSIL),    mild dysplasia and HPV infection, CIN I, at least.     See comment.   COMMENT:  Original and recut sections were examined. Specimen A does not contain the cervical  transformation zone so it may not be entirely representative. Specimen B shows LSIL, at  least. Very focally, near the center of the biopsy, and likely completely excised by the  biopsy, there is a small focus of dysplasia that borders on, but is not completely  diagnostic of, high grade dysplasia (moderate dysplasia/CIN II). Clinical correlation and  follow-up are recommended.   HPV 16-18-45 all negative  Assessment/Plan:  31 y.o.   1. Dysplasia of cervix, low grade (CIN 1)  Patho result as above discussed with patient as well as neg HPV 16-18-45.  Given her desire for eventual conception and the Patho showing CIN 1 with only bordering CIN 2 probably completely excised by Bx, decision to proceed with a repeat Colpo in 4 months.  Risks/Benefits of LEEP discussed.  Low risks of progression to Cervical Cancer explained, but importance of close f/u stressed.  Chances of regression also reviewed.  Folic Acid supplement recommended.  Strict condom use.  F/U Colpo in 4 months.  [name] MD, 10:45 AM 06/19/2016

## 2016-06-26 DIAGNOSIS — M9901 Segmental and somatic dysfunction of cervical region: Secondary | ICD-10-CM | POA: Diagnosis not present

## 2016-06-26 DIAGNOSIS — M9902 Segmental and somatic dysfunction of thoracic region: Secondary | ICD-10-CM | POA: Diagnosis not present

## 2016-06-26 DIAGNOSIS — M542 Cervicalgia: Secondary | ICD-10-CM | POA: Diagnosis not present

## 2016-06-26 DIAGNOSIS — M9903 Segmental and somatic dysfunction of lumbar region: Secondary | ICD-10-CM | POA: Diagnosis not present

## 2016-07-03 ENCOUNTER — Encounter: Payer: BLUE CROSS/BLUE SHIELD | Attending: Obstetrics & Gynecology | Admitting: Registered"

## 2016-07-03 DIAGNOSIS — Z713 Dietary counseling and surveillance: Secondary | ICD-10-CM | POA: Diagnosis not present

## 2016-07-03 DIAGNOSIS — E669 Obesity, unspecified: Secondary | ICD-10-CM

## 2016-07-03 NOTE — Progress Notes (Signed)
Medical Nutrition Therapy:  Appt start time: 0810 end time:  0910.   Assessment:  Primary concerns today: Pt referred for obesity.  Pt says she is having problems losing weight. Pt says she lost her father a few years ago and experienced problems with emotional eating. Pt says she started running one year ago and is eating better now, but she has not seen any changes in weight. Pt says when she goes off of carbohydrates she experiences steatorrhea with oil in her stool. Pt says she does not have this issue when she is eating more balanced meals that include carbohydrates. Pt says she is trying to eat in moderation because cutting out carbohydrates makes her feel shaky while working out and grumpy. Pt says it concerned her when she saw she is considered obese according to the BMI chart. Pt says she feels she is at a better place emotionally regarding her weight. Pt says she is not really concerned about the number on the scales or how her clothes fit, but she is mainly concerned with how she physically feels. Pt says she had more energy when she was at a lighter weight and feels she could run better if she lost weight. Pt says she has stress at work but that she does not think it can be reduced.   Preferred Learning Style:   No preference indicated   Learning Readiness:  Ready  MEDICATIONS: See list.    DIETARY INTAKE:  Usual eating pattern includes 3 (1 larger meal and 2 smaller meals) meals and sometimes has snack(s) in between.  Everyday foods include nuts, dried fruit, chicken, beef.  Avoided foods include fish/all seafood, mayonnaise, sour cream, avocado, tofu.  Pt says she usually eats breakfast in the car, lunch is usually out with others, and dinner is usually eaten in the den while watching TV.     24-hr recall:  B ( AM): Protein pack with nuts, cheese, and dried fruit or low carb protein shake Snk ( AM): Nut, dried fruit, or popcorn L ( PM): [place] from out-lentils, rice, non OR  strip steak, broccoli, mashed potatoes  Snk ( PM): Sometimes nuts, dried fruit, or popcorn D ( PM): grilled chicken breast OR spaghetti with whole grain pasta OR taco salad but on the nights she runs she only drinks chocolate milk for dinner Snk ( PM): Sometimes nuts, dried fruit, or popcorn  Beverages:  Water typically (per pt she drinks~80-100 oz per day), soda once in a while  Pt says she drinks soda with caffeine sometimes if she has migraines.    Usual physical activity: Typically runs 2-3 days per week.   Pt says she has a hard time getting motivated to run but feels very good after running. Pt says she usually gets home around 6-7pm and on running nights she runs 3-7 miles or she may sprint a mile. Typically on running nights she runs 1.5-2 hrs and then will just drink chocolate milk for dinner and not eat anything else until breakfast the next day. Pt says she is not hungry for food after her runs. Pt also says that when she drinks chocolate milk after running she feels like she has had her treat and that she only drinks chocolate milk after exercising.   Pt says that she does not believe in restricting herself from having certain foods, and that she consumes things like sweets in moderation.   Progress Towards Goal(s):  In progress.   Nutritional Diagnosis:  NI-5.11.1 Predicted suboptimal  nutrient intake As related to pt often skipping dinner.  As evidenced by pt's diet recall.    Intervention:  Nutrition counseling provided. Dietitian counseled pt regarding positive body image. Dietitian discussed with pt that there is no one optimal body size. Dietitian encouraged pt to focus on having a healthy relationship with food and nourishing/listening to her body rather than focusing on her weight. Dietitian discussed the importance of consuming adequate carbohydrates to provide energy for the body and the negative effects of skipping meals on metabolism. Dietitian discussed balanced and mindful  eating. Dietitian discussed with pt how pt can include a meal at the end of the day on the days she runs so that she is not going for an extended period without fuel. Dietitian encouraged pt to include stress relieving activities to reduce work related stresses.   Goals:   -Get in three meals per day to provide adequate energy and to promote healthy metabolism.   -Try to get in dinner everyday-on running days as well. Try to add in some nutrition in the evening rather than just drinking chocolate milk. Maybe try to wait until you have cooled down from the run  Before eating and then have a light meal before the end of the day.   -Make sure to include carbohydrates at each meal in order to provide adequate energy.   -Try to practice mindful eating-try to eat a a slower pace and without distractions.   -Try to practice stress relieving activities (yoga, taking a walk, reading, etc) to help relieve work stress.  Teaching Method Utilized: Auditory  Handouts given during visit include:  Balanced plate handout  Barriers to learning/adherence to lifestyle change: None indicated   Demonstrated degree of understanding via:  Teach Back   Monitoring/Evaluation:  Dietary intake, exercise, and body weight in 2 month(s).

## 2016-07-03 NOTE — Patient Instructions (Signed)
Get in three meals per day to provide adequate energy and to promote healthy metabolism.   Try to get in dinner everyday-on running days as well. Try to add in some nutrition in the evening rather than just drinking chocolate milk. Maybe try to wait until you have cooled down from the run  Before eating and then have a light meal before the end of the day.   Make sure to include carbohydrates at each meal in order to provide adequate energy.   Try to practice mindful eating-try to eat a a slower pace and without distractions.   Try to practice stress relieving activities (yoga, take a walk, reading, etc) to help relieve work stress.

## 2016-07-30 ENCOUNTER — Encounter: Payer: Self-pay | Admitting: Physician Assistant

## 2016-07-30 ENCOUNTER — Ambulatory Visit (INDEPENDENT_AMBULATORY_CARE_PROVIDER_SITE_OTHER): Payer: BLUE CROSS/BLUE SHIELD | Admitting: Physician Assistant

## 2016-07-30 VITALS — BP 134/91 | HR 67 | Temp 98.6°F | Resp 16 | Ht 69.0 in | Wt 202.2 lb

## 2016-07-30 DIAGNOSIS — R05 Cough: Secondary | ICD-10-CM

## 2016-07-30 DIAGNOSIS — J101 Influenza due to other identified influenza virus with other respiratory manifestations: Secondary | ICD-10-CM

## 2016-07-30 DIAGNOSIS — R059 Cough, unspecified: Secondary | ICD-10-CM

## 2016-07-30 DIAGNOSIS — R52 Pain, unspecified: Secondary | ICD-10-CM | POA: Diagnosis not present

## 2016-07-30 LAB — POC INFLUENZA A&B (BINAX/QUICKVUE)
Influenza A, POC: POSITIVE — AB
Influenza B, POC: POSITIVE — AB

## 2016-07-30 MED ORDER — HYDROCODONE-HOMATROPINE 5-1.5 MG/5ML PO SYRP: 5.0000 mL | ORAL_SOLUTION | Freq: Three times a day (TID) | ORAL | 0 refills | Status: DC | PRN

## 2016-07-30 MED ORDER — OSELTAMIVIR PHOSPHATE 75 MG PO CAPS: 75.0000 mg | ORAL_CAPSULE | Freq: Two times a day (BID) | ORAL | 0 refills | Status: DC

## 2016-07-30 NOTE — Patient Instructions (Addendum)
Please stay well hydrated. Warm tea with honey will help your sore throat.  Salt water gargles for sore throat.  Get plenty of rest.   Thank you for coming in today. I hope you feel we met your needs.  Feel free to call [facility] if you have any questions or further requests.  Please consider signing up for MyChart if you do not already have it, as this is a great way to communicate with me.  Best,  Whitney McVey, PA-C  Influenza, Adult Influenza, more commonly known as "the flu," is a viral infection that primarily affects the respiratory tract. The respiratory tract includes organs that help you breathe, such as the lungs, nose, and throat. The flu causes many common cold symptoms, as well as a high fever and body aches. The flu spreads easily from person to person (is contagious). Getting a flu shot (influenza vaccination) every year is the best way to prevent influenza. What are the causes? Influenza is caused by a virus. You can catch the virus by:  Breathing in droplets from an infected person's cough or sneeze.  Touching something that was recently contaminated with the virus and then touching your mouth, nose, or eyes.  What increases the risk? The following factors may make you more likely to get the flu:  Not cleaning your hands frequently with soap and water or alcohol-based hand sanitizer.  Having close contact with many people during cold and flu season.  Touching your mouth, eyes, or nose without washing or sanitizing your hands first.  Not drinking enough fluids or not eating a healthy diet.  Not getting enough sleep or exercise.  Being under a high amount of stress.  Not getting a yearly (annual) flu shot.  You may be at a higher risk of complications from the flu, such as a severe lung infection (pneumonia), if you:  Are over the age of 48.  Are pregnant.  Have a weakened disease-fighting system (immune system). You may have a weakened immune system if  you: ? Have HIV or AIDS. ? Are undergoing chemotherapy. ? Aretaking medicines that reduce the activity of (suppress) the immune system.  Have a long-term (chronic) illness, such as heart disease, kidney disease, diabetes, or lung disease.  Have a liver disorder.  Are obese.  Have anemia.  What are the signs or symptoms? Symptoms of this condition typically last 4-10 days and may include:  Fever.  Chills.  Headache, body aches, or muscle aches.  Sore throat.  Cough.  Runny or congested nose.  Chest discomfort and cough.  Poor appetite.  Weakness or tiredness (fatigue).  Dizziness.  Nausea or vomiting.  How is this diagnosed? This condition may be diagnosed based on your medical history and a physical exam. Your health care provider may do a nose or throat swab test to confirm the diagnosis. How is this treated? If influenza is detected early, you can be treated with antiviral medicine that can reduce the length of your illness and the severity of your symptoms. This medicine may be given by mouth (orally) or through an IV tube that is inserted in one of your veins. The goal of treatment is to relieve symptoms by taking care of yourself at home. This may include taking over-the-counter medicines, drinking plenty of fluids, and adding humidity to the air in your home. In some cases, influenza goes away on its own. Severe influenza or complications from influenza may be treated in a hospital. Follow these instructions at home:  Take over-the-counter and prescription medicines only as told by your health care provider.  Use a cool mist humidifier to add humidity to the air in your home. This can make breathing easier.  Rest as needed.  Drink enough fluid to keep your urine clear or pale yellow.  Cover your mouth and nose when you cough or sneeze.  Wash your hands with soap and water often, especially after you cough or sneeze. If soap and water are not available,  use hand sanitizer.  Stay home from work or school as told by your health care provider. Unless you are visiting your health care provider, try to avoid leaving home until your fever has been gone for 24 hours without the use of medicine.  Keep all follow-up visits as told by your health care provider. This is important. How is this prevented?  Getting an annual flu shot is the best way to avoid getting the flu. You may get the flu shot in late summer, fall, or winter. Ask your health care provider when you should get your flu shot.  Wash your hands often or use hand sanitizer often.  Avoid contact with people who are sick during cold and flu season.  Eat a healthy diet, drink plenty of fluids, get enough sleep, and exercise regularly. Contact a health care provider if:  You develop new symptoms.  You have: ? Chest pain. ? Diarrhea. ? A fever.  Your cough gets worse.  You produce more mucus.  You feel nauseous or you vomit. Get help right away if:  You develop shortness of breath or difficulty breathing.  Your skin or nails turn a bluish color.  You have severe pain or stiffness in your neck.  You develop a sudden headache or sudden pain in your face or ear.  You cannot stop vomiting. This information is not intended to replace advice given to you by your health care provider. Make sure you discuss any questions you have with your health care provider. Document Released: 02/09/2000 Document Revised: 07/20/2015 Document Reviewed: 12/06/2014 Elsevier Interactive Patient Education  2017 [employer].  IF you received an x-ray today, you will receive an invoice from [facility]. Please contact [facility] at [phone] with questions or concerns regarding your invoice.   IF you received labwork today, you will receive an invoice from [facility]. Please contact [facility] at [phone] with questions or concerns regarding your invoice.   Our billing staff  will not be able to assist you with questions regarding bills from these companies.  You will be contacted with the lab results as soon as they are available. The fastest way to get your results is to activate your My Chart account. Instructions are located on the last page of this paperwork. If you have not heard from [place] regarding the results in 2 weeks, please contact this office.

## 2016-07-30 NOTE — Progress Notes (Signed)
Angela Joseph  MRN: [id number] DOB: Feb 15, 1986  PCP: Patient, No Pcp Per  Subjective:  Pt is a 31 year old female who presents to clinic for body aches, sore throat, fatigue and headache. Low grade fever 99.5.  "scratchy throat, feels like I need to swallow".  She did get her flu shot this season.  Denies n/v/d, abdominal pain, shob, wheezing, chest pain, palpitations.  She has taken DayQuil- helping some.   Review of Systems  Constitutional: Positive for fatigue and fever. Negative for chills and diaphoresis.  HENT: Positive for rhinorrhea and sore throat. Negative for congestion, postnasal drip, sinus pain, sinus pressure and sneezing.   Respiratory: Positive for cough. Negative for chest tightness, shortness of breath and wheezing.   Cardiovascular: Negative for chest pain and palpitations.  Gastrointestinal: Negative for nausea and vomiting.  Skin: Negative for rash.  Neurological: Positive for headaches. Negative for dizziness.  Psychiatric/Behavioral: Positive for sleep disturbance.    There are no active problems to display for this patient.   Current Outpatient Prescriptions on File Prior to Visit  Medication Sig Dispense Refill  . cholecalciferol (VITAMIN D) 1000 units tablet Take 800 Units by mouth daily.     Marland Kitchen. LEVONORGESTREL-ETHINYL ESTRAD PO Take by mouth.    . Levonorgestrel-Ethinyl Estradiol (LOSEASONIQUE) 0.1-0.02 & 0.01 MG tablet Take 1 tablet by mouth daily.     No current facility-administered medications on file prior to visit.     Allergies  Allergen Reactions  . Ceclor [Cefaclor] Rash and Other (See Comments)    High fever and rash   . Cleocin [Clindamycin Hcl] Rash and Other (See Comments)    Fever and rash in childhood  . Other Rash and Other (See Comments)    All cilins and mycins causes rash and high fever at childhood   . Penicillins Rash and Other (See Comments)    Fever and rash in childhood  . Zinc Rash     Objective:  BP (!) 134/91    Pulse 67   Temp 98.6 F (37 C) (Oral)   Resp 16   Ht 5\' 9"  (1.753 m)   Wt 202 lb 3.2 oz (91.7 kg)   SpO2 98%   BMI 29.86 kg/m   Physical Exam  Constitutional: She is oriented to person, place, and time and well-developed, well-nourished, and in no distress. No distress.  HENT:  Right Ear: Tympanic membrane normal.  Left Ear: Tympanic membrane normal.  Mouth/Throat: Oropharynx is clear and moist and mucous membranes are normal.  Cardiovascular: Normal rate, regular rhythm and normal heart sounds.   Neurological: She is alert and oriented to person, place, and time. GCS score is 15.  Skin: Skin is warm and dry.  Psychiatric: Mood, memory, affect and judgment normal.  Vitals reviewed.  Results for orders placed or performed in visit on 07/30/16  POC Influenza A&B(BINAX/QUICKVUE)  Result Value Ref Range   Influenza A, POC Positive (A) Negative   Influenza B, POC Positive (A) Negative    Assessment and Plan :  1. Influenza A 2. Influenza B 3. Body aches 4. Cough - oseltamivir (TAMIFLU) 75 MG capsule; Take 1 capsule (75 mg total) by mouth 2 (two) times daily.  Dispense: 10 capsule; Refill: 0 - POC Influenza A&B(BINAX/QUICKVUE) - HYDROcodone-homatropine (HYCODAN) 5-1.5 MG/5ML syrup; Take 5 mLs by mouth every 8 (eight) hours as needed for cough.  Dispense: 120 mL; Refill: 0 - Supportive care: push fluids and rest. RTC in 5-7 days if no improvement. Isolation  precautions discussed. She agrees with plan.   Marco Collie, PA-C  Primary Care at [facility] 07/30/2016 4:21 PM

## 2016-07-31 DIAGNOSIS — M9903 Segmental and somatic dysfunction of lumbar region: Secondary | ICD-10-CM | POA: Diagnosis not present

## 2016-07-31 DIAGNOSIS — M542 Cervicalgia: Secondary | ICD-10-CM | POA: Diagnosis not present

## 2016-07-31 DIAGNOSIS — M9902 Segmental and somatic dysfunction of thoracic region: Secondary | ICD-10-CM | POA: Diagnosis not present

## 2016-07-31 DIAGNOSIS — M9901 Segmental and somatic dysfunction of cervical region: Secondary | ICD-10-CM | POA: Diagnosis not present

## 2016-09-13 ENCOUNTER — Ambulatory Visit: Payer: BLUE CROSS/BLUE SHIELD | Admitting: Registered"

## 2016-10-04 DIAGNOSIS — M9903 Segmental and somatic dysfunction of lumbar region: Secondary | ICD-10-CM | POA: Diagnosis not present

## 2016-10-04 DIAGNOSIS — M9902 Segmental and somatic dysfunction of thoracic region: Secondary | ICD-10-CM | POA: Diagnosis not present

## 2016-10-04 DIAGNOSIS — M542 Cervicalgia: Secondary | ICD-10-CM | POA: Diagnosis not present

## 2016-10-04 DIAGNOSIS — M9901 Segmental and somatic dysfunction of cervical region: Secondary | ICD-10-CM | POA: Diagnosis not present

## 2016-10-11 ENCOUNTER — Ambulatory Visit: Payer: BLUE CROSS/BLUE SHIELD | Admitting: Obstetrics & Gynecology

## 2016-10-21 DIAGNOSIS — M542 Cervicalgia: Secondary | ICD-10-CM | POA: Diagnosis not present

## 2016-10-21 DIAGNOSIS — M9901 Segmental and somatic dysfunction of cervical region: Secondary | ICD-10-CM | POA: Diagnosis not present

## 2016-10-21 DIAGNOSIS — M9902 Segmental and somatic dysfunction of thoracic region: Secondary | ICD-10-CM | POA: Diagnosis not present

## 2016-10-21 DIAGNOSIS — M9903 Segmental and somatic dysfunction of lumbar region: Secondary | ICD-10-CM | POA: Diagnosis not present

## 2016-10-23 DIAGNOSIS — M9901 Segmental and somatic dysfunction of cervical region: Secondary | ICD-10-CM | POA: Diagnosis not present

## 2016-10-23 DIAGNOSIS — M9902 Segmental and somatic dysfunction of thoracic region: Secondary | ICD-10-CM | POA: Diagnosis not present

## 2016-10-23 DIAGNOSIS — M9903 Segmental and somatic dysfunction of lumbar region: Secondary | ICD-10-CM | POA: Diagnosis not present

## 2016-10-23 DIAGNOSIS — M542 Cervicalgia: Secondary | ICD-10-CM | POA: Diagnosis not present

## 2016-10-25 ENCOUNTER — Ambulatory Visit: Payer: BLUE CROSS/BLUE SHIELD | Admitting: Obstetrics & Gynecology

## 2016-10-25 DIAGNOSIS — M9901 Segmental and somatic dysfunction of cervical region: Secondary | ICD-10-CM | POA: Diagnosis not present

## 2016-10-25 DIAGNOSIS — M9903 Segmental and somatic dysfunction of lumbar region: Secondary | ICD-10-CM | POA: Diagnosis not present

## 2016-10-25 DIAGNOSIS — M9902 Segmental and somatic dysfunction of thoracic region: Secondary | ICD-10-CM | POA: Diagnosis not present

## 2016-10-25 DIAGNOSIS — M542 Cervicalgia: Secondary | ICD-10-CM | POA: Diagnosis not present

## 2016-11-06 DIAGNOSIS — M9901 Segmental and somatic dysfunction of cervical region: Secondary | ICD-10-CM | POA: Diagnosis not present

## 2016-11-06 DIAGNOSIS — M9902 Segmental and somatic dysfunction of thoracic region: Secondary | ICD-10-CM | POA: Diagnosis not present

## 2016-11-06 DIAGNOSIS — M542 Cervicalgia: Secondary | ICD-10-CM | POA: Diagnosis not present

## 2016-11-06 DIAGNOSIS — M9903 Segmental and somatic dysfunction of lumbar region: Secondary | ICD-10-CM | POA: Diagnosis not present

## 2016-11-07 ENCOUNTER — Ambulatory Visit (INDEPENDENT_AMBULATORY_CARE_PROVIDER_SITE_OTHER): Payer: BLUE CROSS/BLUE SHIELD | Admitting: Obstetrics & Gynecology

## 2016-11-07 ENCOUNTER — Encounter: Payer: Self-pay | Admitting: Obstetrics & Gynecology

## 2016-11-07 VITALS — BP 134/84

## 2016-11-07 DIAGNOSIS — N870 Mild cervical dysplasia: Secondary | ICD-10-CM | POA: Diagnosis not present

## 2016-11-07 NOTE — Addendum Note (Signed)
Addended by: Richardson ChiquitoWILKINSON, KARI S on: 11/07/2016 04:58 PM   Modules accepted: Orders

## 2016-11-07 NOTE — Progress Notes (Signed)
    Angela Joseph January 08, 1986 [id number]        31 y.o.  G0 single  RP:  F/U Colpo for CIN1/Bordering CIN2 in 05/2016  HPI:  No complaint.  HPV 16-18-45 neg.  Past medical history,surgical history, problem list, medications, allergies, family history and social history were all reviewed and documented in the EPIC chart.  Directed ROS with pertinent positives and negatives documented in the history of present illness/assessment and plan.  Exam:  Vitals:   11/07/16 1618  BP: 134/84   General appearance:  Normal  Colposcopy Procedure Note Angela Joseph 11/07/2016  Indications:  Procedure Details  The risks and benefits of the procedure and Verbal informed consent obtained.  Speculum placed in vagina and excellent visualization of cervix achieved, cervix swabbed x 3 with acetic acid solution.  Findings:  Cervix colposcopy:  Physical Exam  Genitourinary:      Vaginal colposcopy:  Normal  Vulvar colposcopy:  Grossly normal  Perirectal colposcopy:  Grossly normal   Specimens: Cervical Bx at 2 and 7 O'clock  Complications:  None.  Hemostasis with Silver Nitrate and Moncel. .  Plan:  Pending Cervical Bxs, management per results.   Assessment/Plan:  31 y.o. No obstetric history on fi  1. Dysplasia of cervix, low grade (CIN 1, Bordering CIN 2) F/U Colposcopy today.  Possible HGSIL at 7 O'clock.  Pending Cervical Bxs.  Management per results.   Angela DelMarie-Lyne Dason Mosley MD, 4:42 PM 11/07/2016

## 2016-11-07 NOTE — Patient Instructions (Signed)
1. Dysplasia of cervix, low grade (CIN 1, Bordering CIN 2) F/U Colposcopy today.  Possible HGSIL at 7 O'clock.  Pending Cervical Bxs.  Management per results.   Good to see you today Angela Joseph!  I will inform you of your results as soon as available.

## 2016-11-08 ENCOUNTER — Ambulatory Visit: Payer: BLUE CROSS/BLUE SHIELD | Admitting: Obstetrics & Gynecology

## 2016-11-12 LAB — TISSUE PATH REPORT

## 2016-11-12 LAB — PATHOLOGY

## 2016-11-27 DIAGNOSIS — M9903 Segmental and somatic dysfunction of lumbar region: Secondary | ICD-10-CM | POA: Diagnosis not present

## 2016-11-27 DIAGNOSIS — M9901 Segmental and somatic dysfunction of cervical region: Secondary | ICD-10-CM | POA: Diagnosis not present

## 2016-11-27 DIAGNOSIS — M542 Cervicalgia: Secondary | ICD-10-CM | POA: Diagnosis not present

## 2016-11-27 DIAGNOSIS — M9902 Segmental and somatic dysfunction of thoracic region: Secondary | ICD-10-CM | POA: Diagnosis not present

## 2017-03-14 DIAGNOSIS — M9901 Segmental and somatic dysfunction of cervical region: Secondary | ICD-10-CM | POA: Diagnosis not present

## 2017-03-14 DIAGNOSIS — M9906 Segmental and somatic dysfunction of lower extremity: Secondary | ICD-10-CM | POA: Diagnosis not present

## 2017-03-14 DIAGNOSIS — M722 Plantar fascial fibromatosis: Secondary | ICD-10-CM | POA: Diagnosis not present

## 2017-03-14 DIAGNOSIS — M2140 Flat foot [pes planus] (acquired), unspecified foot: Secondary | ICD-10-CM | POA: Diagnosis not present

## 2017-03-28 DIAGNOSIS — M9901 Segmental and somatic dysfunction of cervical region: Secondary | ICD-10-CM | POA: Diagnosis not present

## 2017-03-28 DIAGNOSIS — M2140 Flat foot [pes planus] (acquired), unspecified foot: Secondary | ICD-10-CM | POA: Diagnosis not present

## 2017-03-28 DIAGNOSIS — M722 Plantar fascial fibromatosis: Secondary | ICD-10-CM | POA: Diagnosis not present

## 2017-03-28 DIAGNOSIS — M9906 Segmental and somatic dysfunction of lower extremity: Secondary | ICD-10-CM | POA: Diagnosis not present

## 2017-04-02 DIAGNOSIS — M9902 Segmental and somatic dysfunction of thoracic region: Secondary | ICD-10-CM | POA: Diagnosis not present

## 2017-04-02 DIAGNOSIS — M9906 Segmental and somatic dysfunction of lower extremity: Secondary | ICD-10-CM | POA: Diagnosis not present

## 2017-04-02 DIAGNOSIS — M722 Plantar fascial fibromatosis: Secondary | ICD-10-CM | POA: Diagnosis not present

## 2017-04-02 DIAGNOSIS — M9905 Segmental and somatic dysfunction of pelvic region: Secondary | ICD-10-CM | POA: Diagnosis not present

## 2017-04-02 DIAGNOSIS — M9903 Segmental and somatic dysfunction of lumbar region: Secondary | ICD-10-CM | POA: Diagnosis not present

## 2017-04-07 DIAGNOSIS — M9906 Segmental and somatic dysfunction of lower extremity: Secondary | ICD-10-CM | POA: Diagnosis not present

## 2017-04-07 DIAGNOSIS — M9903 Segmental and somatic dysfunction of lumbar region: Secondary | ICD-10-CM | POA: Diagnosis not present

## 2017-04-07 DIAGNOSIS — M9902 Segmental and somatic dysfunction of thoracic region: Secondary | ICD-10-CM | POA: Diagnosis not present

## 2017-04-07 DIAGNOSIS — M9905 Segmental and somatic dysfunction of pelvic region: Secondary | ICD-10-CM | POA: Diagnosis not present

## 2017-04-07 DIAGNOSIS — M722 Plantar fascial fibromatosis: Secondary | ICD-10-CM | POA: Diagnosis not present

## 2017-04-09 DIAGNOSIS — M9902 Segmental and somatic dysfunction of thoracic region: Secondary | ICD-10-CM | POA: Diagnosis not present

## 2017-04-09 DIAGNOSIS — M9903 Segmental and somatic dysfunction of lumbar region: Secondary | ICD-10-CM | POA: Diagnosis not present

## 2017-04-09 DIAGNOSIS — M722 Plantar fascial fibromatosis: Secondary | ICD-10-CM | POA: Diagnosis not present

## 2017-04-09 DIAGNOSIS — M9905 Segmental and somatic dysfunction of pelvic region: Secondary | ICD-10-CM | POA: Diagnosis not present

## 2017-04-09 DIAGNOSIS — M9906 Segmental and somatic dysfunction of lower extremity: Secondary | ICD-10-CM | POA: Diagnosis not present

## 2017-04-16 DIAGNOSIS — M9903 Segmental and somatic dysfunction of lumbar region: Secondary | ICD-10-CM | POA: Diagnosis not present

## 2017-04-16 DIAGNOSIS — M9902 Segmental and somatic dysfunction of thoracic region: Secondary | ICD-10-CM | POA: Diagnosis not present

## 2017-04-16 DIAGNOSIS — M9906 Segmental and somatic dysfunction of lower extremity: Secondary | ICD-10-CM | POA: Diagnosis not present

## 2017-04-16 DIAGNOSIS — M722 Plantar fascial fibromatosis: Secondary | ICD-10-CM | POA: Diagnosis not present

## 2017-04-16 DIAGNOSIS — M9905 Segmental and somatic dysfunction of pelvic region: Secondary | ICD-10-CM | POA: Diagnosis not present

## 2017-04-23 DIAGNOSIS — M9902 Segmental and somatic dysfunction of thoracic region: Secondary | ICD-10-CM | POA: Diagnosis not present

## 2017-04-23 DIAGNOSIS — M9906 Segmental and somatic dysfunction of lower extremity: Secondary | ICD-10-CM | POA: Diagnosis not present

## 2017-04-23 DIAGNOSIS — M9905 Segmental and somatic dysfunction of pelvic region: Secondary | ICD-10-CM | POA: Diagnosis not present

## 2017-04-23 DIAGNOSIS — M9903 Segmental and somatic dysfunction of lumbar region: Secondary | ICD-10-CM | POA: Diagnosis not present

## 2017-04-25 DIAGNOSIS — M722 Plantar fascial fibromatosis: Secondary | ICD-10-CM | POA: Diagnosis not present

## 2017-04-25 DIAGNOSIS — M9906 Segmental and somatic dysfunction of lower extremity: Secondary | ICD-10-CM | POA: Diagnosis not present

## 2017-04-25 DIAGNOSIS — M9901 Segmental and somatic dysfunction of cervical region: Secondary | ICD-10-CM | POA: Diagnosis not present

## 2017-04-25 DIAGNOSIS — M2140 Flat foot [pes planus] (acquired), unspecified foot: Secondary | ICD-10-CM | POA: Diagnosis not present

## 2017-04-30 DIAGNOSIS — M9905 Segmental and somatic dysfunction of pelvic region: Secondary | ICD-10-CM | POA: Diagnosis not present

## 2017-04-30 DIAGNOSIS — M9903 Segmental and somatic dysfunction of lumbar region: Secondary | ICD-10-CM | POA: Diagnosis not present

## 2017-04-30 DIAGNOSIS — M9902 Segmental and somatic dysfunction of thoracic region: Secondary | ICD-10-CM | POA: Diagnosis not present

## 2017-04-30 DIAGNOSIS — M9906 Segmental and somatic dysfunction of lower extremity: Secondary | ICD-10-CM | POA: Diagnosis not present

## 2017-05-05 DIAGNOSIS — M9903 Segmental and somatic dysfunction of lumbar region: Secondary | ICD-10-CM | POA: Diagnosis not present

## 2017-05-05 DIAGNOSIS — M9906 Segmental and somatic dysfunction of lower extremity: Secondary | ICD-10-CM | POA: Diagnosis not present

## 2017-05-05 DIAGNOSIS — M9905 Segmental and somatic dysfunction of pelvic region: Secondary | ICD-10-CM | POA: Diagnosis not present

## 2017-05-05 DIAGNOSIS — M9902 Segmental and somatic dysfunction of thoracic region: Secondary | ICD-10-CM | POA: Diagnosis not present

## 2017-05-16 DIAGNOSIS — M9905 Segmental and somatic dysfunction of pelvic region: Secondary | ICD-10-CM | POA: Diagnosis not present

## 2017-05-16 DIAGNOSIS — M9903 Segmental and somatic dysfunction of lumbar region: Secondary | ICD-10-CM | POA: Diagnosis not present

## 2017-05-16 DIAGNOSIS — M9902 Segmental and somatic dysfunction of thoracic region: Secondary | ICD-10-CM | POA: Diagnosis not present

## 2017-05-16 DIAGNOSIS — M9906 Segmental and somatic dysfunction of lower extremity: Secondary | ICD-10-CM | POA: Diagnosis not present

## 2017-05-22 DIAGNOSIS — M9905 Segmental and somatic dysfunction of pelvic region: Secondary | ICD-10-CM | POA: Diagnosis not present

## 2017-05-22 DIAGNOSIS — M9903 Segmental and somatic dysfunction of lumbar region: Secondary | ICD-10-CM | POA: Diagnosis not present

## 2017-05-22 DIAGNOSIS — M9906 Segmental and somatic dysfunction of lower extremity: Secondary | ICD-10-CM | POA: Diagnosis not present

## 2017-05-22 DIAGNOSIS — M9902 Segmental and somatic dysfunction of thoracic region: Secondary | ICD-10-CM | POA: Diagnosis not present

## 2017-05-28 DIAGNOSIS — M722 Plantar fascial fibromatosis: Secondary | ICD-10-CM | POA: Diagnosis not present

## 2017-05-28 DIAGNOSIS — M2140 Flat foot [pes planus] (acquired), unspecified foot: Secondary | ICD-10-CM | POA: Diagnosis not present

## 2017-05-28 DIAGNOSIS — M9906 Segmental and somatic dysfunction of lower extremity: Secondary | ICD-10-CM | POA: Diagnosis not present

## 2017-05-28 DIAGNOSIS — M9901 Segmental and somatic dysfunction of cervical region: Secondary | ICD-10-CM | POA: Diagnosis not present

## 2017-06-05 DIAGNOSIS — M9902 Segmental and somatic dysfunction of thoracic region: Secondary | ICD-10-CM | POA: Diagnosis not present

## 2017-06-05 DIAGNOSIS — M9906 Segmental and somatic dysfunction of lower extremity: Secondary | ICD-10-CM | POA: Diagnosis not present

## 2017-06-05 DIAGNOSIS — M9903 Segmental and somatic dysfunction of lumbar region: Secondary | ICD-10-CM | POA: Diagnosis not present

## 2017-06-05 DIAGNOSIS — M9905 Segmental and somatic dysfunction of pelvic region: Secondary | ICD-10-CM | POA: Diagnosis not present

## 2017-06-25 DIAGNOSIS — M2140 Flat foot [pes planus] (acquired), unspecified foot: Secondary | ICD-10-CM | POA: Diagnosis not present

## 2017-06-25 DIAGNOSIS — M9901 Segmental and somatic dysfunction of cervical region: Secondary | ICD-10-CM | POA: Diagnosis not present

## 2017-06-25 DIAGNOSIS — M722 Plantar fascial fibromatosis: Secondary | ICD-10-CM | POA: Diagnosis not present

## 2017-06-25 DIAGNOSIS — M9906 Segmental and somatic dysfunction of lower extremity: Secondary | ICD-10-CM | POA: Diagnosis not present

## 2017-07-23 DIAGNOSIS — M9903 Segmental and somatic dysfunction of lumbar region: Secondary | ICD-10-CM | POA: Diagnosis not present

## 2017-07-23 DIAGNOSIS — M9906 Segmental and somatic dysfunction of lower extremity: Secondary | ICD-10-CM | POA: Diagnosis not present

## 2017-07-23 DIAGNOSIS — M2140 Flat foot [pes planus] (acquired), unspecified foot: Secondary | ICD-10-CM | POA: Diagnosis not present

## 2017-07-23 DIAGNOSIS — M9901 Segmental and somatic dysfunction of cervical region: Secondary | ICD-10-CM | POA: Diagnosis not present

## 2017-07-29 ENCOUNTER — Telehealth: Payer: Self-pay | Admitting: *Deleted

## 2017-07-29 MED ORDER — LEVONORGEST-ETH ESTRAD 91-DAY 0.15-0.03 &0.01 MG PO TABS: 1.0000 | ORAL_TABLET | Freq: Every day | ORAL | 3 refills | Status: DC

## 2017-07-29 NOTE — Telephone Encounter (Signed)
Patient has annual exam scheduled 11/10/17, needs refill on Seasonique [place] chart scanned in epic. Rx sent.

## 2017-08-04 DIAGNOSIS — M9903 Segmental and somatic dysfunction of lumbar region: Secondary | ICD-10-CM | POA: Diagnosis not present

## 2017-08-04 DIAGNOSIS — M9902 Segmental and somatic dysfunction of thoracic region: Secondary | ICD-10-CM | POA: Diagnosis not present

## 2017-08-04 DIAGNOSIS — M9905 Segmental and somatic dysfunction of pelvic region: Secondary | ICD-10-CM | POA: Diagnosis not present

## 2017-08-04 DIAGNOSIS — M9906 Segmental and somatic dysfunction of lower extremity: Secondary | ICD-10-CM | POA: Diagnosis not present

## 2017-09-24 DIAGNOSIS — M2140 Flat foot [pes planus] (acquired), unspecified foot: Secondary | ICD-10-CM | POA: Diagnosis not present

## 2017-09-24 DIAGNOSIS — M9906 Segmental and somatic dysfunction of lower extremity: Secondary | ICD-10-CM | POA: Diagnosis not present

## 2017-09-24 DIAGNOSIS — M9903 Segmental and somatic dysfunction of lumbar region: Secondary | ICD-10-CM | POA: Diagnosis not present

## 2017-09-24 DIAGNOSIS — M9901 Segmental and somatic dysfunction of cervical region: Secondary | ICD-10-CM | POA: Diagnosis not present

## 2017-10-28 DIAGNOSIS — M2140 Flat foot [pes planus] (acquired), unspecified foot: Secondary | ICD-10-CM | POA: Diagnosis not present

## 2017-10-28 DIAGNOSIS — M9906 Segmental and somatic dysfunction of lower extremity: Secondary | ICD-10-CM | POA: Diagnosis not present

## 2017-10-28 DIAGNOSIS — M9903 Segmental and somatic dysfunction of lumbar region: Secondary | ICD-10-CM | POA: Diagnosis not present

## 2017-10-28 DIAGNOSIS — M9901 Segmental and somatic dysfunction of cervical region: Secondary | ICD-10-CM | POA: Diagnosis not present

## 2017-10-31 ENCOUNTER — Encounter: Payer: BLUE CROSS/BLUE SHIELD | Admitting: Obstetrics & Gynecology

## 2017-11-10 ENCOUNTER — Encounter: Payer: BLUE CROSS/BLUE SHIELD | Admitting: Obstetrics & Gynecology

## 2017-11-25 DIAGNOSIS — M9901 Segmental and somatic dysfunction of cervical region: Secondary | ICD-10-CM | POA: Diagnosis not present

## 2017-11-25 DIAGNOSIS — M2140 Flat foot [pes planus] (acquired), unspecified foot: Secondary | ICD-10-CM | POA: Diagnosis not present

## 2017-11-25 DIAGNOSIS — M9906 Segmental and somatic dysfunction of lower extremity: Secondary | ICD-10-CM | POA: Diagnosis not present

## 2017-11-25 DIAGNOSIS — M9903 Segmental and somatic dysfunction of lumbar region: Secondary | ICD-10-CM | POA: Diagnosis not present

## 2017-12-12 ENCOUNTER — Ambulatory Visit: Payer: BLUE CROSS/BLUE SHIELD | Admitting: Obstetrics & Gynecology

## 2017-12-12 ENCOUNTER — Encounter: Payer: Self-pay | Admitting: Obstetrics & Gynecology

## 2017-12-12 VITALS — BP 150/90 | Ht 69.5 in | Wt 200.0 lb

## 2017-12-12 DIAGNOSIS — Z3041 Encounter for surveillance of contraceptive pills: Secondary | ICD-10-CM

## 2017-12-12 DIAGNOSIS — Z113 Encounter for screening for infections with a predominantly sexual mode of transmission: Secondary | ICD-10-CM

## 2017-12-12 DIAGNOSIS — R87610 Atypical squamous cells of undetermined significance on cytologic smear of cervix (ASC-US): Secondary | ICD-10-CM | POA: Diagnosis not present

## 2017-12-12 DIAGNOSIS — Z1151 Encounter for screening for human papillomavirus (HPV): Secondary | ICD-10-CM

## 2017-12-12 DIAGNOSIS — Z01419 Encounter for gynecological examination (general) (routine) without abnormal findings: Secondary | ICD-10-CM | POA: Diagnosis not present

## 2017-12-12 MED ORDER — NORETHIN ACE-ETH ESTRAD-FE 1-20 MG-MCG(24) PO TABS: 1.0000 | ORAL_TABLET | Freq: Every day | ORAL | 4 refills | Status: DC

## 2017-12-12 NOTE — Patient Instructions (Signed)
1. Encounter for routine gynecological examination with Papanicolaou smear of cervix Normal gynecologic exam.  Pap with high-risk HPV done today.  History of CIN one in April and September 2018.  Breast exam normal with mildly fibrocystic breast bilaterally.  Self breast exam monthly recommended.  Body mass index 29.11.  Recommend decreasing calories/carbs and increasing aerobic physical activity and weightlifting every 2 days. - Pap IG, CT/NG NAA, and HPV (high risk)  2. Encounter for surveillance of contraceptive pills Breakthrough bleeding on continuous birth control pills with Seasonique.  Will change to low media 24 FE 1/20.  Will try to use continuously.  Usage reviewed and prescription sent to pharmacy.  3. Screen for STD (sexually transmitted disease) - HIV antibody (with reflex) - RPR - Hepatitis C Antibody - Hepatitis B Surface AntiGEN - Pap IG, CT/NG NAA, and HPV (high risk)  4. Special screening examination for human papillomavirus (HPV) - Pap IG, CT/NG NAA, and HPV (high risk)  Other orders - Norethindrone Acetate-Ethinyl Estrad-FE (LOESTRIN 24 FE) 1-20 MG-MCG(24) tablet; Take 1 tablet by mouth daily. Continuous use for severe dysmenorrhea  Angela Joseph, it was a pleasure seeing you today!  I will inform you of your results as soon as they are available.

## 2017-12-12 NOTE — Progress Notes (Signed)
Angela Joseph Jun 30, 1985 [id number]   History:    32 y.o. G0 Longstanding Boyfriend  RP:  Established patient presenting for annual gyn exam   HPI: On Seasonique for severe dysmenorrhea, but BTB for about 2 weeks every month.  No Pelvic pain when not bleeding.  No pain with IC.  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  HPV 16-18-45 negative.  Urine/BMs wnl.  Breasts wnl. BMI 29.11.  Physically active.  Past medical history,surgical history, family history and social history were all reviewed and documented in the EPIC chart.  Gynecologic History No LMP recorded. (Menstrual status: Oral contraceptives). Contraception: OCP (estrogen/progesterone) Last Pap: Abnormal.  Colpo 05/2016 and 10/2016 CIN 1. Last mammogram: Never Bone Density: Never Colonoscopy: Never  Obstetric History OB History  Gravida Para Term Preterm AB Living  0 0 0 0 0 0  SAB TAB Ectopic Multiple Live Births  0 0 0 0 0     ROS: A ROS was performed and pertinent positives and negatives are included in the history.  GENERAL: No fevers or chills. HEENT: No change in vision, no earache, sore throat or sinus congestion. NECK: No pain or stiffness. CARDIOVASCULAR: No chest pain or pressure. No palpitations. PULMONARY: No shortness of breath, cough or wheeze. GASTROINTESTINAL: No abdominal pain, nausea, vomiting or diarrhea, melena or bright red blood per rectum. GENITOURINARY: No urinary frequency, urgency, hesitancy or dysuria. MUSCULOSKELETAL: No joint or muscle pain, no back pain, no recent trauma. DERMATOLOGIC: No rash, no itching, no lesions. ENDOCRINE: No polyuria, polydipsia, no heat or cold intolerance. No recent change in weight. HEMATOLOGICAL: No anemia or easy bruising or bleeding. NEUROLOGIC: No headache, seizures, numbness, tingling or weakness. PSYCHIATRIC: No depression, no loss of interest in normal activity or change in sleep pattern.     Exam:   BP (!) 150/90   Ht 5' 9.5" (1.765 m)   Wt 200 lb (90.7 kg)    BMI 29.11 kg/m   Body mass index is 29.11 kg/m.  General appearance : Well developed well nourished female. No acute distress HEENT: Eyes: no retinal hemorrhage or exudates,  Neck supple, trachea midline, no carotid bruits, no thyroidmegaly Lungs: Clear to auscultation, no rhonchi or wheezes, or rib retractions  Heart: Regular rate and rhythm, no murmurs or gallops Breast:Examined in sitting and supine position were symmetrical in appearance, mildly fibrocystic bilaterally, no palpable masses or tenderness,  no skin retraction, no nipple inversion, no nipple discharge, no skin discoloration, no axillary or supraclavicular lymphadenopathy Abdomen: no palpable masses or tenderness, no rebound or guarding Extremities: no edema or skin discoloration or tenderness  Pelvic: Vulva: Normal             Vagina: No gross lesions or discharge  Cervix: No gross lesions or discharge.  Pap/HPV HR, Gono-Chlam done  Uterus  AV, normal size, shape and consistency, non-tender and mobile  Adnexa  Without masses or tenderness  Anus: Normal   Assessment/Plan:  32 y.o. female for annual exam   1. Encounter for routine gynecological examination with Papanicolaou smear of cervix Normal gynecologic exam.  Pap with high-risk HPV done today.  History of CIN one in April and September 2018.  Breast exam normal with mildly fibrocystic breast bilaterally.  Self breast exam monthly recommended.  Body mass index 29.11.  Recommend decreasing calories/carbs and increasing aerobic physical activity and weightlifting every 2 days. - Pap IG, CT/NG NAA, and HPV (high risk)  2. Encounter for surveillance of contraceptive pills Breakthrough bleeding  on continuous birth control pills with Seasonique.  Will change to low media 24 FE 1/20.  Will try to use continuously.  Usage reviewed and prescription sent to pharmacy.  3. Screen for STD (sexually transmitted disease) - HIV antibody (with reflex) - RPR - Hepatitis C  Antibody - Hepatitis B Surface AntiGEN - Pap IG, CT/NG NAA, and HPV (high risk)  4. Special screening examination for human papillomavirus (HPV) - Pap IG, CT/NG NAA, and HPV (high risk)  Other orders - Norethindrone Acetate-Ethinyl Estrad-FE (LOESTRIN 24 FE) 1-20 MG-MCG(24) tablet; Take 1 tablet by mouth daily. Continuous use for severe dysmenorrhea  [name] MD, 4:43 PM 12/12/2017

## 2017-12-15 LAB — HEPATITIS C ANTIBODY
Hepatitis C Ab: NONREACTIVE
SIGNAL TO CUT-OFF: 0.02 (ref <1.00)

## 2017-12-15 LAB — HIV ANTIBODY (ROUTINE TESTING W REFLEX): HIV 1&2 Ab, 4th Generation: NONREACTIVE

## 2017-12-15 LAB — HEPATITIS B SURFACE ANTIGEN: HEP B S AG: NONREACTIVE

## 2017-12-15 LAB — RPR: RPR Ser Ql: NONREACTIVE

## 2017-12-16 LAB — PAP IG, CT-NG NAA, HPV HIGH-RISK
C. TRACHOMATIS RNA, TMA: NOT DETECTED
HPV DNA HIGH RISK: NOT DETECTED
N. gonorrhoeae RNA, TMA: NOT DETECTED

## 2017-12-23 DIAGNOSIS — M9903 Segmental and somatic dysfunction of lumbar region: Secondary | ICD-10-CM | POA: Diagnosis not present

## 2017-12-23 DIAGNOSIS — M9901 Segmental and somatic dysfunction of cervical region: Secondary | ICD-10-CM | POA: Diagnosis not present

## 2017-12-23 DIAGNOSIS — M9906 Segmental and somatic dysfunction of lower extremity: Secondary | ICD-10-CM | POA: Diagnosis not present

## 2017-12-23 DIAGNOSIS — M2140 Flat foot [pes planus] (acquired), unspecified foot: Secondary | ICD-10-CM | POA: Diagnosis not present

## 2018-01-20 DIAGNOSIS — M9901 Segmental and somatic dysfunction of cervical region: Secondary | ICD-10-CM | POA: Diagnosis not present

## 2018-01-20 DIAGNOSIS — M2140 Flat foot [pes planus] (acquired), unspecified foot: Secondary | ICD-10-CM | POA: Diagnosis not present

## 2018-01-20 DIAGNOSIS — M9906 Segmental and somatic dysfunction of lower extremity: Secondary | ICD-10-CM | POA: Diagnosis not present

## 2018-01-20 DIAGNOSIS — M9903 Segmental and somatic dysfunction of lumbar region: Secondary | ICD-10-CM | POA: Diagnosis not present

## 2018-03-02 DIAGNOSIS — M2140 Flat foot [pes planus] (acquired), unspecified foot: Secondary | ICD-10-CM | POA: Diagnosis not present

## 2018-03-02 DIAGNOSIS — M9901 Segmental and somatic dysfunction of cervical region: Secondary | ICD-10-CM | POA: Diagnosis not present

## 2018-03-02 DIAGNOSIS — M9906 Segmental and somatic dysfunction of lower extremity: Secondary | ICD-10-CM | POA: Diagnosis not present

## 2018-03-02 DIAGNOSIS — M9903 Segmental and somatic dysfunction of lumbar region: Secondary | ICD-10-CM | POA: Diagnosis not present

## 2018-03-17 DIAGNOSIS — M9903 Segmental and somatic dysfunction of lumbar region: Secondary | ICD-10-CM | POA: Diagnosis not present

## 2018-03-17 DIAGNOSIS — M9901 Segmental and somatic dysfunction of cervical region: Secondary | ICD-10-CM | POA: Diagnosis not present

## 2018-03-17 DIAGNOSIS — M9906 Segmental and somatic dysfunction of lower extremity: Secondary | ICD-10-CM | POA: Diagnosis not present

## 2018-03-17 DIAGNOSIS — M2140 Flat foot [pes planus] (acquired), unspecified foot: Secondary | ICD-10-CM | POA: Diagnosis not present

## 2018-03-19 DIAGNOSIS — M2140 Flat foot [pes planus] (acquired), unspecified foot: Secondary | ICD-10-CM | POA: Diagnosis not present

## 2018-03-19 DIAGNOSIS — M9903 Segmental and somatic dysfunction of lumbar region: Secondary | ICD-10-CM | POA: Diagnosis not present

## 2018-03-19 DIAGNOSIS — M9901 Segmental and somatic dysfunction of cervical region: Secondary | ICD-10-CM | POA: Diagnosis not present

## 2018-03-19 DIAGNOSIS — M9906 Segmental and somatic dysfunction of lower extremity: Secondary | ICD-10-CM | POA: Diagnosis not present

## 2018-03-26 DIAGNOSIS — M2140 Flat foot [pes planus] (acquired), unspecified foot: Secondary | ICD-10-CM | POA: Diagnosis not present

## 2018-03-26 DIAGNOSIS — M9901 Segmental and somatic dysfunction of cervical region: Secondary | ICD-10-CM | POA: Diagnosis not present

## 2018-03-26 DIAGNOSIS — M9906 Segmental and somatic dysfunction of lower extremity: Secondary | ICD-10-CM | POA: Diagnosis not present

## 2018-03-26 DIAGNOSIS — M9903 Segmental and somatic dysfunction of lumbar region: Secondary | ICD-10-CM | POA: Diagnosis not present

## 2018-03-27 ENCOUNTER — Encounter (HOSPITAL_COMMUNITY): Payer: Self-pay | Admitting: Emergency Medicine

## 2018-03-27 ENCOUNTER — Ambulatory Visit (HOSPITAL_COMMUNITY)
Admission: EM | Admit: 2018-03-27 | Discharge: 2018-03-27 | Disposition: A | Discharge status: Home / Self Care | Payer: BLUE CROSS/BLUE SHIELD | Attending: Internal Medicine | Admitting: Internal Medicine

## 2018-03-27 DIAGNOSIS — R52 Pain, unspecified: Secondary | ICD-10-CM

## 2018-03-27 DIAGNOSIS — J111 Influenza due to unidentified influenza virus with other respiratory manifestations: Secondary | ICD-10-CM | POA: Diagnosis not present

## 2018-03-27 DIAGNOSIS — J029 Acute pharyngitis, unspecified: Secondary | ICD-10-CM | POA: Diagnosis not present

## 2018-03-27 DIAGNOSIS — R05 Cough: Secondary | ICD-10-CM

## 2018-03-27 DIAGNOSIS — Z20828 Contact with and (suspected) exposure to other viral communicable diseases: Secondary | ICD-10-CM | POA: Diagnosis not present

## 2018-03-27 LAB — POCT RAPID STREP A: Streptococcus, Group A Screen (Direct): NEGATIVE

## 2018-03-27 MED ORDER — OSELTAMIVIR PHOSPHATE 75 MG PO CAPS: 75.0000 mg | ORAL_CAPSULE | Freq: Two times a day (BID) | ORAL | 0 refills | Status: DC

## 2018-03-27 MED ORDER — OLOPATADINE HCL 0.1 % OP SOLN
1.0000 [drp] | Freq: Two times a day (BID) | OPHTHALMIC | 12 refills | Status: DC
Start: 2018-03-27 — End: 2018-03-27

## 2018-03-27 NOTE — ED Provider Notes (Deleted)
[facility]    CSN: [id number] Arrival date & time: 03/27/18  1419     History   Chief Complaint Chief Complaint  Patient presents with  . URI    HPI Angela Joseph is a 33 y.o. female complaints of swelling on the scalp of 3 months duration and bilateral eye itching with redness.  Eye itching started a couple of days ago.  She has a history of seasonal allergies and has been using over-the-counter eyedrops with no improvement.  She denies any fever or chills.  No body aches.  No wheezing.  Patient also complains of a 5764-month history of scalp swelling and she has an appointment with a dermatologist in April for removal of the cyst.  No discharge from the cyst.  No exquisite tenderness.  No erythema.  HPI  Past Medical History:  Diagnosis Date  . Allergy   . Migraine     There are no active problems to display for this patient.   Past Surgical History:  Procedure Laterality Date  . DENTAL SURGERY    . KNEE SURGERY      OB History    Gravida  0   Para  0   Term  0   Preterm  0   AB  0   Living  0     SAB  0   TAB  0   Ectopic  0   Multiple  0   Live Births  0            Home Medications    Prior to Admission medications   Medication Sig Start Date End Date Taking? Authorizing Provider  cholecalciferol (VITAMIN D) 1000 units tablet Take 800 Units by mouth daily.     [provider]  Norethindrone Acetate-Ethinyl Estrad-FE (LOESTRIN 24 FE) 1-20 MG-MCG(24) tablet Take 1 tablet by mouth daily. Continuous use for severe dysmenorrhea 12/12/17   Genia DelLavoie, Marie-[place], MD  olopatadine (PATANOL) 0.1 % ophthalmic solution Place 1 drop into both eyes 2 (two) times daily. 03/27/18   LampteyBritta Mccreedy, Philip O, MD    Family History Family History  Problem Relation Age of Onset  . Hypertension Father   . Cancer Paternal Grandmother     Social History Social History   Tobacco Use  . Smoking status: Never Smoker  . Smokeless tobacco: Never  Used  Substance Use Topics  . Alcohol use: Yes  . Drug use: No     Allergies   Ceclor [cefaclor]; Cleocin [clindamycin hcl]; Other; Penicillins; and Zinc   Review of Systems Review of Systems  Constitutional: Negative for appetite change, chills and fever.  HENT: Negative for ear discharge, ear pain and postnasal drip.   Eyes: Positive for discharge, redness and itching. Negative for photophobia, pain and visual disturbance.  Cardiovascular: Negative for chest pain and palpitations.     Physical Exam Triage Vital Signs ED Triage Vitals  Enc Vitals Group     BP 03/27/18 1632 (!) 154/77     Pulse Rate 03/27/18 1632 72     Resp 03/27/18 1632 18     Temp 03/27/18 1632 98.5 F (36.9 C)     Temp Source 03/27/18 1632 Temporal     SpO2 03/27/18 1632 100 %     Weight --      Height --      Head Circumference --      Peak Flow --      Pain Score 03/27/18 1633 2  Pain Loc --      Pain Edu? --      Excl. in GC? --    No data found.  Updated Vital Signs BP (!) 154/77   Pulse 72   Temp 98.5 F (36.9 C) (Temporal)   Resp 18   SpO2 100%   Visual Acuity Right Eye Distance:   Left Eye Distance:   Bilateral Distance:    Right Eye Near:   Left Eye Near:    Bilateral Near:     Physical Exam Constitutional:      Appearance: Normal appearance.  HENT:     Head: Normocephalic.     Right Ear: Tympanic membrane and ear canal normal.     Left Ear: Tympanic membrane and ear canal normal.     Nose: No congestion or rhinorrhea.  Eyes:     Comments: Clear eye discharge.  Erythema of the conjunctivae bilaterally.  Cardiovascular:     Rate and Rhythm: Normal rate and regular rhythm.  Pulmonary:     Effort: Pulmonary effort is normal. No respiratory distress.     Breath sounds: Normal breath sounds.  Neurological:     Mental Status: She is alert.      UC Treatments / Results  Labs (all labs ordered are listed, but only abnormal results are displayed) Labs Reviewed    CULTURE, GROUP A STREP [facility])  POCT RAPID STREP A    EKG None  Radiology No results found.  Procedures Procedures (including critical care time)  Medications Ordered in UC Medications - No data to display  Initial Impression / Assessment and Plan / UC Course  I have reviewed the triage vital signs and the nursing notes.  Pertinent labs & imaging results that were available during my care of the patient were reviewed by me and considered in my medical decision making (see chart for details).     1.  Allergic conjunctivitis: Patanol eyedrops She may benefit from Zyrtec.  2.  Sebaceous cyst of the scalp. Follow-up with the dermatologist as scheduled. Final Clinical Impressions(s) / UC Diagnoses   Final diagnoses:  Allergic conjunctivitis of both eyes  Sebaceous cyst   Discharge Instructions   None    ED Prescriptions    Medication Sig Dispense Auth. Provider   olopatadine (PATANOL) 0.1 % ophthalmic solution Place 1 drop into both eyes 2 (two) times daily. 5 mL Lamptey, Britta Mccreedy, MD     Controlled Substance Prescriptions [place] Controlled Substance Registry consulted? No   Merrilee Jansky, MD 03/27/18 [phone]

## 2018-03-27 NOTE — ED Provider Notes (Signed)
[facility]    CSN: [id number] Arrival date & time: 03/27/18  1419     History   Chief Complaint Chief Complaint  Patient presents with  . URI    HPI Angela Joseph is a 33 y.o. female with no past medical history comes to the urgent care department with complaints of runny nose, cough, sore throat, body aches of 3 days duration.  Patient states that several of her colleagues at work have been diagnosed with flu and has been treated with Tamiflu.  She denies any nausea or vomiting.  No chest pain or chest pressure.  No sputum production.  She has not tried any over-the-counter medications.   HPI  Past Medical History:  Diagnosis Date  . Allergy   . Migraine     There are no active problems to display for this patient.   Past Surgical History:  Procedure Laterality Date  . DENTAL SURGERY    . KNEE SURGERY      OB History    Gravida  0   Para  0   Term  0   Preterm  0   AB  0   Living  0     SAB  0   TAB  0   Ectopic  0   Multiple  0   Live Births  0            Home Medications    Prior to Admission medications   Medication Sig Start Date End Date Taking? Authorizing Provider  cholecalciferol (VITAMIN D) 1000 units tablet Take 800 Units by mouth daily.     [provider]  Norethindrone Acetate-Ethinyl Estrad-FE (LOESTRIN 24 FE) 1-20 MG-MCG(24) tablet Take 1 tablet by mouth daily. Continuous use for severe dysmenorrhea 12/12/17   [name], MD  oseltamivir (TAMIFLU) 75 MG capsule Take 1 capsule (75 mg total) by mouth every 12 (twelve) hours. 03/27/18   LampteyBritta Mccreedy, MD    Family History Family History  Problem Relation Age of Onset  . Hypertension Father   . Cancer Paternal Grandmother     Social History Social History   Tobacco Use  . Smoking status: Never Smoker  . Smokeless tobacco: Never Used  Substance Use Topics  . Alcohol use: Yes  . Drug use: No     Allergies   Ceclor [cefaclor];  Cleocin [clindamycin hcl]; Other; Penicillins; and Zinc   Review of Systems Review of Systems  Constitutional: Positive for activity change, appetite change and chills.  HENT: Negative for ear discharge, ear pain and mouth sores.   Eyes: Negative for discharge and itching.  Respiratory: Negative for cough, chest tightness and stridor.   Cardiovascular: Negative for chest pain and palpitations.  Gastrointestinal: Negative for abdominal distention, abdominal pain and diarrhea.     Physical Exam Triage Vital Signs ED Triage Vitals  Enc Vitals Group     BP 03/27/18 1632 (!) 154/77     Pulse Rate 03/27/18 1632 72     Resp 03/27/18 1632 18     Temp 03/27/18 1632 98.5 F (36.9 C)     Temp Source 03/27/18 1632 Temporal     SpO2 03/27/18 1632 100 %     Weight --      Height --      Head Circumference --      Peak Flow --      Pain Score 03/27/18 1633 2     Pain Loc --  Pain Edu? --      Excl. in GC? --    No data found.  Updated Vital Signs BP (!) 154/77   Pulse 72   Temp 98.5 F (36.9 C) (Temporal)   Resp 18   SpO2 100%   Visual Acuity Right Eye Distance:   Left Eye Distance:   Bilateral Distance:    Right Eye Near:   Left Eye Near:    Bilateral Near:     Physical Exam Constitutional:      Appearance: Normal appearance. She is ill-appearing.  HENT:     Right Ear: Tympanic membrane normal.     Left Ear: Tympanic membrane normal.     Nose: Nose normal.     Mouth/Throat:     Mouth: Mucous membranes are moist.     Pharynx: No oropharyngeal exudate or posterior oropharyngeal erythema.  Eyes:     Extraocular Movements: Extraocular movements intact.     Conjunctiva/sclera: Conjunctivae normal.     Pupils: Pupils are equal, round, and reactive to light.  Cardiovascular:     Rate and Rhythm: Normal rate and regular rhythm.     Heart sounds: No friction rub.  Pulmonary:     Effort: Pulmonary effort is normal.     Breath sounds: Normal breath sounds.    Neurological:     Mental Status: She is alert.      UC Treatments / Results  Labs (all labs ordered are listed, but only abnormal results are displayed) Labs Reviewed  CULTURE, GROUP A STREP [facility]  POCT RAPID STREP A    EKG None  Radiology No results found.  Procedures Procedures (including critical care time)  Medications Ordered in UC Medications - No data to display  Initial Impression / Assessment and Plan / UC Course  I have reviewed the triage vital signs and the nursing notes.  Pertinent labs & imaging results that were available during my care of the patient were reviewed by me and considered in my medical decision making (see chart for details).     1.  Influenza: Tamiflu 75 mg orally twice daily for 5 days Encourage oral fluid intake Tylenol/NSAID for pain/fever  I called the pharmacy to discontinue the Patanol prescription.  This was accidentally placed in the patient's chart.  Final Clinical Impressions(s) / UC Diagnoses   Final diagnoses:  Influenza   Discharge Instructions   None    ED Prescriptions    Medication Sig Dispense Auth. Provider   olopatadine (PATANOL) 0.1 % ophthalmic solution  (Status: Discontinued) Place 1 drop into both eyes 2 (two) times daily. 5 mL Kassy Mcenroe, Britta MccreedyPhilip O, MD   oseltamivir (TAMIFLU) 75 MG capsule Take 1 capsule (75 mg total) by mouth every 12 (twelve) hours. 10 capsule Alder Murri, Britta MccreedyPhilip O, MD     Controlled Substance Prescriptions [place] Controlled Substance Registry consulted? No   Merrilee JanskyLamptey, Chevelle Coulson O, MD 03/27/18 Ebony [id number]

## 2018-03-27 NOTE — ED Triage Notes (Signed)
Pt c/o cold symptoms, body aches, low grade fever, poor appetite, states 5 people in her office came down with the flu. Symptoms started last night.

## 2018-03-30 LAB — CULTURE, GROUP A STREP (THRC)

## 2018-04-01 ENCOUNTER — Emergency Department (HOSPITAL_COMMUNITY): Payer: BLUE CROSS/BLUE SHIELD

## 2018-04-01 ENCOUNTER — Emergency Department (HOSPITAL_COMMUNITY)
Admission: EM | Admit: 2018-04-01 | Discharge: 2018-04-02 | Disposition: A | Discharge status: Home / Self Care | Payer: BLUE CROSS/BLUE SHIELD | Attending: Emergency Medicine | Admitting: Emergency Medicine

## 2018-04-01 ENCOUNTER — Encounter (HOSPITAL_COMMUNITY): Payer: Self-pay | Admitting: Obstetrics and Gynecology

## 2018-04-01 ENCOUNTER — Other Ambulatory Visit: Payer: Self-pay

## 2018-04-01 DIAGNOSIS — Z79899 Other long term (current) drug therapy: Secondary | ICD-10-CM | POA: Diagnosis not present

## 2018-04-01 DIAGNOSIS — R1031 Right lower quadrant pain: Secondary | ICD-10-CM | POA: Insufficient documentation

## 2018-04-01 LAB — CBC
HCT: 43.4 % (ref 36.0–46.0)
Hemoglobin: 14.2 g/dL (ref 12.0–15.0)
MCH: 28.6 pg (ref 26.0–34.0)
MCHC: 32.7 g/dL (ref 30.0–36.0)
MCV: 87.5 fL (ref 80.0–100.0)
Platelets: 420 10*3/uL — ABNORMAL HIGH (ref 150–400)
RBC: 4.96 MIL/uL (ref 3.87–5.11)
RDW: 12.3 % (ref 11.5–15.5)
WBC: 10 10*3/uL (ref 4.0–10.5)
nRBC: 0 % (ref 0.0–0.2)

## 2018-04-01 LAB — COMPREHENSIVE METABOLIC PANEL
ALK PHOS: 102 U/L (ref 38–126)
ALT: 12 U/L (ref 0–44)
ANION GAP: 8 (ref 5–15)
AST: 18 U/L (ref 15–41)
Albumin: 4 g/dL (ref 3.5–5.0)
BUN: 10 mg/dL (ref 6–20)
CO2: 23 mmol/L (ref 22–32)
Calcium: 8.6 mg/dL — ABNORMAL LOW (ref 8.9–10.3)
Chloride: 106 mmol/L (ref 98–111)
Creatinine, Ser: 0.87 mg/dL (ref 0.44–1.00)
GFR calc Af Amer: >60 mL/min (ref >60)
GLUCOSE: 143 mg/dL — AB (ref 70–99)
POTASSIUM: 3.6 mmol/L (ref 3.5–5.1)
Sodium: 137 mmol/L (ref 135–145)
TOTAL PROTEIN: 7.8 g/dL (ref 6.5–8.1)
Total Bilirubin: 0.3 mg/dL (ref 0.3–1.2)

## 2018-04-01 LAB — LIPASE, BLOOD: Lipase: 48 U/L (ref 11–51)

## 2018-04-01 LAB — I-STAT BETA HCG BLOOD, ED (MC, WL, AP ONLY)

## 2018-04-01 MED ORDER — SODIUM CHLORIDE 0.9% FLUSH
3.0000 mL | Freq: Once | INTRAVENOUS | Status: AC
  Administered 2018-04-01: 3 mL via INTRAVENOUS

## 2018-04-01 MED ORDER — IOPAMIDOL (ISOVUE-300) INJECTION 61%
INTRAVENOUS | Status: AC
  Filled 2018-04-01: qty 100

## 2018-04-01 MED ORDER — ONDANSETRON HCL 4 MG/2ML IJ SOLN
4.0000 mg | Freq: Once | INTRAMUSCULAR | Status: AC
  Administered 2018-04-01: 4 mg via INTRAVENOUS
  Filled 2018-04-01: qty 2

## 2018-04-01 MED ORDER — SODIUM CHLORIDE (PF) 0.9 % IJ SOLN
INTRAMUSCULAR | Status: AC
  Filled 2018-04-01: qty 50

## 2018-04-01 MED ORDER — IOPAMIDOL (ISOVUE-300) INJECTION 61%
100.0000 mL | Freq: Once | INTRAVENOUS | Status: AC | PRN
  Administered 2018-04-01: 100 mL via INTRAVENOUS

## 2018-04-01 MED ORDER — MORPHINE SULFATE (PF) 4 MG/ML IV SOLN
4.0000 mg | Freq: Once | INTRAVENOUS | Status: AC
  Administered 2018-04-01: 4 mg via INTRAVENOUS
  Filled 2018-04-01: qty 1

## 2018-04-01 NOTE — ED Provider Notes (Signed)
[facility]-EMERGENCY DEPT Provider Note   CSN: [id number] Arrival date & time: 04/01/18  2018     History   Chief Complaint Chief Complaint  Patient presents with  . Abdominal Pain    HPI Angela Joseph is a 33 y.o. female.  The history is provided by the patient and medical records.  Abdominal Pain     33 year old female with history of seasonal allergies, migraine headaches, presenting to the ED with abdominal pain.  States she has recently gotten over the flu but over the past 48 hours has had worsening right lower quadrant abdominal pain.  States she has been feeling achy for several days from the flu, however this feels different.  She has not had any nausea or vomiting but has not really had much of an appetite.  Thought maybe she was constipated so took a Dulcolax and had a large bowel movement but has not noticed any change in the pain.  She denies any urinary symptoms, vaginal discharge, or pelvic pain.  She has been taking 800 mg Motrin at home without much relief.  Past Medical History:  Diagnosis Date  . Allergy   . Migraine     There are no active problems to display for this patient.   Past Surgical History:  Procedure Laterality Date  . DENTAL SURGERY    . KNEE SURGERY       OB History    Gravida  0   Para  0   Term  0   Preterm  0   AB  0   Living  0     SAB  0   TAB  0   Ectopic  0   Multiple  0   Live Births  0            Home Medications    Prior to Admission medications   Medication Sig Start Date End Date Taking? Authorizing Provider  BLISOVI 24 FE 1-20 MG-MCG(24) tablet Take 1 tablet by mouth daily. 01/14/18  Yes [provider]  cholecalciferol (VITAMIN D) 1000 units tablet Take 800 Units by mouth daily.    Yes [provider]  ibuprofen (ADVIL,MOTRIN) 200 MG tablet Take 400 mg by mouth daily as needed for moderate pain.   Yes [provider]  oseltamivir (TAMIFLU) 75  MG capsule Take 1 capsule (75 mg total) by mouth every 12 (twelve) hours. Patient not taking: Reported on 04/01/2018 03/27/18   Merrilee JanskyLamptey, Philip O, MD    Family History Family History  Problem Relation Age of Onset  . Hypertension Father   . Cancer Paternal Grandmother     Social History Social History   Tobacco Use  . Smoking status: Never Smoker  . Smokeless tobacco: Never Used  Substance Use Topics  . Alcohol use: Yes  . Drug use: No     Allergies   Tamiflu [oseltamivir phosphate]; Ceclor [cefaclor]; Cleocin [clindamycin hcl]; Other; Penicillins; and Zinc   Review of Systems Review of Systems  Constitutional: Positive for appetite change.  Gastrointestinal: Positive for abdominal pain.  All other systems reviewed and are negative.    Physical Exam Updated Vital Signs BP (!) 154/82 (BP Location: Left Arm)   Pulse 88   Temp 98 F (36.7 C)   Resp 16   SpO2 100%   Physical Exam Vitals signs and nursing note reviewed.  Constitutional:      Appearance: She is well-developed.  HENT:     Head: Normocephalic  and atraumatic.  Eyes:     Conjunctiva/sclera: Conjunctivae normal.     Pupils: Pupils are equal, round, and reactive to light.  Neck:     Musculoskeletal: Normal range of motion.  Cardiovascular:     Rate and Rhythm: Normal rate and regular rhythm.     Heart sounds: Normal heart sounds.  Pulmonary:     Effort: Pulmonary effort is normal.     Breath sounds: Normal breath sounds.  Abdominal:     General: Bowel sounds are normal.     Palpations: Abdomen is soft.     Tenderness: There is abdominal tenderness in the right lower quadrant.  Musculoskeletal: Normal range of motion.  Skin:    General: Skin is warm and dry.  Neurological:     Mental Status: She is alert and oriented to person, place, and time.      ED Treatments / Results  Labs (all labs ordered are listed, but only abnormal results are displayed) Labs Reviewed  COMPREHENSIVE METABOLIC  PANEL - Abnormal; Notable for the following components:      Result Value   Glucose, Bld 143 (*)    Calcium 8.6 (*)    All other components within normal limits  CBC - Abnormal; Notable for the following components:   Platelets 420 (*)    All other components within normal limits  URINALYSIS, ROUTINE W REFLEX MICROSCOPIC - Abnormal; Notable for the following components:   Color, Urine STRAW (*)    APPearance HAZY (*)    Hgb urine dipstick SMALL (*)    Leukocytes, UA LARGE (*)    Bacteria, UA RARE (*)    All other components within normal limits  LIPASE, BLOOD  I-STAT BETA HCG BLOOD, ED ([facility], [facility], [facility] ONLY)    EKG None  Radiology Ct Abdomen Pelvis W Contrast  Result Date: 04/02/2018 CLINICAL DATA:  Right lower quadrant pain EXAM: CT ABDOMEN AND PELVIS WITH CONTRAST TECHNIQUE: Multidetector CT imaging of the abdomen and pelvis was performed using the standard protocol following bolus administration of intravenous contrast. CONTRAST:  ISOVUE-300 IOPAMIDOL (ISOVUE-300) INJECTION 61% COMPARISON:  None. FINDINGS: Lower chest: No acute abnormality. Hepatobiliary: No focal liver abnormality is seen. No gallstones, gallbladder wall thickening, or biliary dilatation. Pancreas: Unremarkable. No pancreatic ductal dilatation or surrounding inflammatory changes. Spleen: Normal in size without focal abnormality. Adrenals/Urinary Tract: Adrenal glands are unremarkable. Kidneys are normal, without renal calculi, focal lesion, or hydronephrosis. Bladder is unremarkable. Stomach/Bowel: Stomach is within normal limits. Appendix appears normal. No evidence of bowel wall thickening, distention, or inflammatory changes. Vascular/Lymphatic: No significant vascular findings are present. No enlarged abdominal or pelvic lymph nodes. Reproductive: Uterus and bilateral adnexa are unremarkable. Other: No abdominal wall hernia or abnormality. No abdominopelvic ascites. Musculoskeletal: No acute or significant osseous  findings. IMPRESSION: 1. Normal appendix. 2. No acute abdominal or pelvic pathology. Electronically Signed   By: Elige Ko   On: 04/02/2018 00:16    Procedures Procedures (including critical care time)  Medications Ordered in ED Medications  iopamidol (ISOVUE-300) 61 % injection (has no administration in time range)  sodium chloride (PF) 0.9 % injection (has no administration in time range)  sodium chloride flush (NS) 0.9 % injection 3 mL (3 mLs Intravenous Given 04/01/18 2333)  ondansetron (ZOFRAN) injection 4 mg (4 mg Intravenous Given 04/01/18 2326)  morphine 4 MG/ML injection 4 mg (4 mg Intravenous Given 04/01/18 2329)  iopamidol (ISOVUE-300) 61 % injection 100 mL (100 mLs Intravenous Contrast Given 04/01/18 2347)  Initial Impression / Assessment and Plan / ED Course  I have reviewed the triage vital signs and the nursing notes.  Pertinent labs & imaging results that were available during my care of the patient were reviewed by me and considered in my medical decision making (see chart for details).  33 year old female here with right lower quadrant pain for the past 48 hours.  Recently getting over the flu but reports this feels different.  She is afebrile and nontoxic.  Does have normal bowel sounds but noticed in the right lower quadrant.  She is not peritoneal.  Labs overall reassuring.  Urinalysis is pending.  Will obtain CT to rule out appendicitis.  CT is negative.  UA appears contaminated with large leuks, rare bacteria but 6-10 squamous cells.  Patient is not having any current urinary symptoms.  Symptoms may be remnants from flu.  Will discharge home with symptomatic control.  Continue gentle diet and oral fluids.  Will need to establish care with PCP-- given information for local clinics.  Can return here for any new/acute changes.  Final Clinical Impressions(s) / ED Diagnoses   Final diagnoses:  RLQ abdominal pain    ED Discharge Orders         Ordered    traMADol  (ULTRAM) 50 MG tablet  Every 6 hours PRN     04/02/18 0051    ondansetron (ZOFRAN ODT) 4 MG disintegrating tablet  Every 8 hours PRN     04/02/18 0051           Garlon HatchetSanders, Zayah Keilman M, PA-C 04/02/18 [id number]    Jacalyn LefevreHaviland, Julie, MD 04/02/18 [id number]

## 2018-04-01 NOTE — ED Notes (Signed)
Pt transported to CT ?

## 2018-04-01 NOTE — ED Triage Notes (Signed)
Pt reports she is having severe abdominal pain on the right side. Pt reports it is worse with standing and walking and receded a little with laying down.  Pt reports she recently got over the flu.  Pt reports she took a laxative at home to make sure she was not constipated and has had multiple stools since.  Pt reports she has a poor appetite.

## 2018-04-02 DIAGNOSIS — R1031 Right lower quadrant pain: Secondary | ICD-10-CM | POA: Diagnosis not present

## 2018-04-02 LAB — URINALYSIS, ROUTINE W REFLEX MICROSCOPIC
BILIRUBIN URINE: NEGATIVE
GLUCOSE, UA: NEGATIVE mg/dL
Ketones, ur: NEGATIVE mg/dL
NITRITE: NEGATIVE
PROTEIN: NEGATIVE mg/dL
Specific Gravity, Urine: 1.027 (ref 1.005–1.030)
pH: 7 (ref 5.0–8.0)

## 2018-04-02 MED ORDER — ONDANSETRON 4 MG PO TBDP: 4.0000 mg | ORAL_TABLET | Freq: Three times a day (TID) | ORAL | 0 refills | Status: DC | PRN

## 2018-04-02 MED ORDER — TRAMADOL HCL 50 MG PO TABS: 50.0000 mg | ORAL_TABLET | Freq: Four times a day (QID) | ORAL | 0 refills | Status: DC | PRN

## 2018-04-02 NOTE — ED Notes (Addendum)
Pt. Documented in error CT ABDOMEN PELVIS W CONTRAST. 

## 2018-04-02 NOTE — ED Notes (Signed)
Pt verbalized discharge instructions and follow up care. Alert and ambulatory. Vitals stable  

## 2018-04-02 NOTE — Discharge Instructions (Signed)
Please establish care with primary care doctor-- large practices in the area are [place] and [place], or one of the family med/urgent care centers.  See resources attached.  Can also use the 800 number on this paperwork or number on back of your insurance card to find local offices. Take the prescribed medication as directed. Return to the ED for new or worsening symptoms.

## 2018-06-12 DIAGNOSIS — M9901 Segmental and somatic dysfunction of cervical region: Secondary | ICD-10-CM | POA: Diagnosis not present

## 2018-06-12 DIAGNOSIS — M2140 Flat foot [pes planus] (acquired), unspecified foot: Secondary | ICD-10-CM | POA: Diagnosis not present

## 2018-06-12 DIAGNOSIS — M9903 Segmental and somatic dysfunction of lumbar region: Secondary | ICD-10-CM | POA: Diagnosis not present

## 2018-06-12 DIAGNOSIS — M9906 Segmental and somatic dysfunction of lower extremity: Secondary | ICD-10-CM | POA: Diagnosis not present

## 2018-06-15 DIAGNOSIS — M9906 Segmental and somatic dysfunction of lower extremity: Secondary | ICD-10-CM | POA: Diagnosis not present

## 2018-06-15 DIAGNOSIS — M2140 Flat foot [pes planus] (acquired), unspecified foot: Secondary | ICD-10-CM | POA: Diagnosis not present

## 2018-06-15 DIAGNOSIS — M9903 Segmental and somatic dysfunction of lumbar region: Secondary | ICD-10-CM | POA: Diagnosis not present

## 2018-06-15 DIAGNOSIS — M9901 Segmental and somatic dysfunction of cervical region: Secondary | ICD-10-CM | POA: Diagnosis not present

## 2018-09-22 ENCOUNTER — Other Ambulatory Visit: Payer: Self-pay

## 2018-09-22 ENCOUNTER — Ambulatory Visit (HOSPITAL_COMMUNITY)
Admission: EM | Admit: 2018-09-22 | Discharge: 2018-09-22 | Disposition: A | Discharge status: Home / Self Care | Payer: BC Managed Care – PPO | Attending: Urgent Care | Admitting: Urgent Care

## 2018-09-22 ENCOUNTER — Encounter (HOSPITAL_COMMUNITY): Payer: Self-pay | Admitting: Emergency Medicine

## 2018-09-22 DIAGNOSIS — L299 Pruritus, unspecified: Secondary | ICD-10-CM

## 2018-09-22 DIAGNOSIS — T63461A Toxic effect of venom of wasps, accidental (unintentional), initial encounter: Secondary | ICD-10-CM

## 2018-09-22 DIAGNOSIS — T7840XA Allergy, unspecified, initial encounter: Secondary | ICD-10-CM

## 2018-09-22 MED ORDER — HYDROXYZINE HCL 25 MG PO TABS: 25.0000 mg | ORAL_TABLET | Freq: Three times a day (TID) | ORAL | 0 refills | Status: DC | PRN

## 2018-09-22 MED ORDER — PREDNISONE 20 MG PO TABS: ORAL_TABLET | ORAL | 0 refills | Status: DC

## 2018-09-22 NOTE — ED Triage Notes (Signed)
Pt states on Sunday she was stung by a yellow jacket. L upper leg, large area of redness noted to thigh.

## 2018-09-22 NOTE — ED Provider Notes (Signed)
MRN: [id number] DOB: September 15, 1985  Subjective:   Angela Joseph is a 33 y.o. female presenting for 2-day history of acute onset worsening left thigh redness and mild swelling following a yellowjacket sting just above her knee.  Patient reports very mild pain and mostly itching but she is concerned about having hardening of her skin around her wound.  She has been using antihistamines.  Has a history of significant allergic reactions.  Has never had anaphylaxis.   No current facility-administered medications for this encounter.   Current Outpatient Medications:  .  BLISOVI 24 FE 1-20 MG-MCG(24) tablet, Take 1 tablet by mouth daily., Disp: , Rfl:  .  cholecalciferol (VITAMIN D) 1000 units tablet, Take 800 Units by mouth daily. , Disp: , Rfl:  .  ibuprofen (ADVIL,MOTRIN) 200 MG tablet, Take 400 mg by mouth daily as needed for moderate pain., Disp: , Rfl:  .  traMADol (ULTRAM) 50 MG tablet, Take 1 tablet (50 mg total) by mouth every 6 (six) hours as needed. (Patient not taking: Reported on 09/22/2018), Disp: 15 tablet, Rfl: 0    Allergies  Allergen Reactions  . Tamiflu [Oseltamivir Phosphate]   . Ceclor [Cefaclor] Rash and Other (See Comments)    High fever and rash   . Cleocin [Clindamycin Hcl] Rash and Other (See Comments)    Fever and rash in childhood  . Other Rash and Other (See Comments)    All cilins and mycins causes rash and high fever at childhood   . Penicillins Rash and Other (See Comments)    Fever and rash in childhood Did it involve swelling of the face/tongue/throat, SOB, or low BP? Y Did it involve sudden or severe rash/hives, skin peeling, or any reaction on the inside of your mouth or nose? UNK Did you need to seek medical attention at a hospital or doctor's office? UNK When did it last happen?more than 10 years If all above answers are "NO", may proceed with cephalosporin use.   Marland Kitchen. Zinc Rash    Past Medical History:  Diagnosis Date  . Allergy   . Migraine       Past Surgical History:  Procedure Laterality Date  . DENTAL SURGERY    . KNEE SURGERY      ROS Denies fever, facial swelling, oral swelling, chest pain, chest tightness, shortness of breath, wheezing, nausea, vomiting, abdominal pain.  Denies drainage of pus from her wound.  Objective:   Vitals: BP (!) 156/91 (BP Location: Left Arm)   Pulse 88   Temp 97.9 F (36.6 C) (Temporal)   Resp 16   SpO2 100%   Physical Exam Constitutional:      General: She is not in acute distress.    Appearance: Normal appearance. She is well-developed. She is not ill-appearing.  HENT:     Head: Normocephalic and atraumatic.     Nose: Nose normal.     Mouth/Throat:     Mouth: Mucous membranes are moist.     Pharynx: Oropharynx is clear.  Eyes:     General: No scleral icterus.    Extraocular Movements: Extraocular movements intact.     Pupils: Pupils are equal, round, and reactive to light.  Cardiovascular:     Rate and Rhythm: Normal rate.  Pulmonary:     Effort: Pulmonary effort is normal.  Skin:    General: Skin is warm and dry.       Neurological:     General: No focal deficit present.     Mental  Status: She is alert and oriented to person, place, and time.  Psychiatric:        Mood and Affect: Mood normal.        Behavior: Behavior normal.      Assessment and Plan :   1. Yellow jacket sting, accidental or unintentional, initial encounter   2. Allergic reaction, initial encounter   3. Itching     Will use prednisone orally for aggressive management of allergic reaction/contact dermatitis.  Use Vistaril for itching and histamine properties. Counseled patient on potential for adverse effects with medications prescribed/recommended today, ER and return-to-clinic precautions discussed, patient verbalized understanding.    Jaynee Eagles, [place] 09/22/18 1952

## 2018-09-22 NOTE — Discharge Instructions (Addendum)
If you develop fever, facial swelling, chest tightness, nausea and vomiting, then stop the prednisone and/or Vistaril and report to the ER.

## 2018-10-27 DIAGNOSIS — M9903 Segmental and somatic dysfunction of lumbar region: Secondary | ICD-10-CM | POA: Diagnosis not present

## 2018-10-27 DIAGNOSIS — M9902 Segmental and somatic dysfunction of thoracic region: Secondary | ICD-10-CM | POA: Diagnosis not present

## 2018-10-27 DIAGNOSIS — M9901 Segmental and somatic dysfunction of cervical region: Secondary | ICD-10-CM | POA: Diagnosis not present

## 2018-11-11 DIAGNOSIS — M9901 Segmental and somatic dysfunction of cervical region: Secondary | ICD-10-CM | POA: Diagnosis not present

## 2018-11-11 DIAGNOSIS — M9902 Segmental and somatic dysfunction of thoracic region: Secondary | ICD-10-CM | POA: Diagnosis not present

## 2018-11-11 DIAGNOSIS — M9903 Segmental and somatic dysfunction of lumbar region: Secondary | ICD-10-CM | POA: Diagnosis not present

## 2018-12-02 DIAGNOSIS — M9902 Segmental and somatic dysfunction of thoracic region: Secondary | ICD-10-CM | POA: Diagnosis not present

## 2018-12-02 DIAGNOSIS — M9901 Segmental and somatic dysfunction of cervical region: Secondary | ICD-10-CM | POA: Diagnosis not present

## 2018-12-02 DIAGNOSIS — M9903 Segmental and somatic dysfunction of lumbar region: Secondary | ICD-10-CM | POA: Diagnosis not present

## 2018-12-06 ENCOUNTER — Other Ambulatory Visit: Payer: Self-pay

## 2018-12-06 ENCOUNTER — Observation Stay (HOSPITAL_COMMUNITY): Payer: BC Managed Care – PPO | Admitting: Anesthesiology

## 2018-12-06 ENCOUNTER — Encounter (HOSPITAL_COMMUNITY): Payer: Self-pay | Admitting: Emergency Medicine

## 2018-12-06 ENCOUNTER — Observation Stay (HOSPITAL_COMMUNITY)
Admission: EM | Admit: 2018-12-06 | Discharge: 2018-12-07 | Disposition: A | Discharge status: Home / Self Care | Payer: BC Managed Care – PPO | Attending: Surgery | Admitting: Surgery

## 2018-12-06 ENCOUNTER — Emergency Department (HOSPITAL_COMMUNITY): Payer: BC Managed Care – PPO

## 2018-12-06 ENCOUNTER — Ambulatory Visit (HOSPITAL_COMMUNITY)
Admission: EM | Admit: 2018-12-06 | Discharge: 2018-12-06 | Disposition: A | Discharge status: Home / Self Care | Payer: BC Managed Care – PPO | Source: Home / Self Care

## 2018-12-06 ENCOUNTER — Encounter (HOSPITAL_COMMUNITY)
Admission: EM | Disposition: A | Discharge status: Home / Self Care | Payer: Self-pay | Source: Home / Self Care | Attending: Emergency Medicine

## 2018-12-06 DIAGNOSIS — Z880 Allergy status to penicillin: Secondary | ICD-10-CM | POA: Insufficient documentation

## 2018-12-06 DIAGNOSIS — K3580 Unspecified acute appendicitis: Secondary | ICD-10-CM | POA: Diagnosis present

## 2018-12-06 DIAGNOSIS — R1031 Right lower quadrant pain: Secondary | ICD-10-CM | POA: Diagnosis not present

## 2018-12-06 DIAGNOSIS — Z20828 Contact with and (suspected) exposure to other viral communicable diseases: Secondary | ICD-10-CM | POA: Insufficient documentation

## 2018-12-06 DIAGNOSIS — Z79899 Other long term (current) drug therapy: Secondary | ICD-10-CM | POA: Diagnosis not present

## 2018-12-06 DIAGNOSIS — Z03818 Encounter for observation for suspected exposure to other biological agents ruled out: Secondary | ICD-10-CM | POA: Diagnosis not present

## 2018-12-06 DIAGNOSIS — Z793 Long term (current) use of hormonal contraceptives: Secondary | ICD-10-CM | POA: Insufficient documentation

## 2018-12-06 DIAGNOSIS — K3533 Acute appendicitis with perforation and localized peritonitis, with abscess: Secondary | ICD-10-CM | POA: Diagnosis not present

## 2018-12-06 DIAGNOSIS — R109 Unspecified abdominal pain: Secondary | ICD-10-CM | POA: Diagnosis not present

## 2018-12-06 DIAGNOSIS — Z881 Allergy status to other antibiotic agents status: Secondary | ICD-10-CM | POA: Insufficient documentation

## 2018-12-06 DIAGNOSIS — K353 Acute appendicitis with localized peritonitis, without perforation or gangrene: Secondary | ICD-10-CM | POA: Diagnosis not present

## 2018-12-06 HISTORY — PX: LAPAROSCOPIC APPENDECTOMY: SHX408

## 2018-12-06 LAB — CBC WITH DIFFERENTIAL/PLATELET
Abs Immature Granulocytes: 0.1 10*3/uL — ABNORMAL HIGH (ref 0.00–0.07)
Abs Immature Granulocytes: 0.1 10*3/uL — ABNORMAL HIGH (ref 0.00–0.07)
Basophils Absolute: 0 10*3/uL (ref 0.0–0.1)
Basophils Absolute: 0 10*3/uL (ref 0.0–0.1)
Basophils Relative: 0 %
Basophils Relative: 0 %
Eosinophils Absolute: 0.1 10*3/uL (ref 0.0–0.5)
Eosinophils Absolute: 0.1 10*3/uL (ref 0.0–0.5)
Eosinophils Relative: 1 %
Eosinophils Relative: 1 %
HCT: 43.2 % (ref 36.0–46.0)
HCT: 39.6 % (ref 36.0–46.0)
Hemoglobin: 14.7 g/dL (ref 12.0–15.0)
Hemoglobin: 13.5 g/dL (ref 12.0–15.0)
Immature Granulocytes: 1 %
Immature Granulocytes: 1 %
Lymphocytes Relative: 9 %
Lymphocytes Relative: 7 %
Lymphs Abs: 1.5 10*3/uL (ref 0.7–4.0)
Lymphs Abs: 1.4 10*3/uL (ref 0.7–4.0)
MCH: 29.1 pg (ref 26.0–34.0)
MCH: 29.3 pg (ref 26.0–34.0)
MCHC: 34 g/dL (ref 30.0–36.0)
MCHC: 34.1 g/dL (ref 30.0–36.0)
MCV: 85.5 fL (ref 80.0–100.0)
MCV: 85.9 fL (ref 80.0–100.0)
Monocytes Absolute: 1 10*3/uL (ref 0.1–1.0)
Monocytes Absolute: 0.8 10*3/uL (ref 0.1–1.0)
Monocytes Relative: 6 %
Monocytes Relative: 4 %
Neutro Abs: 13.9 10*3/uL — ABNORMAL HIGH (ref 1.7–7.7)
Neutro Abs: 17.6 10*3/uL — ABNORMAL HIGH (ref 1.7–7.7)
Neutrophils Relative %: 83 %
Neutrophils Relative %: 87 %
Platelets: 366 10*3/uL (ref 150–400)
Platelets: 320 10*3/uL (ref 150–400)
RBC: 5.05 MIL/uL (ref 3.87–5.11)
RBC: 4.61 MIL/uL (ref 3.87–5.11)
RDW: 12.8 % (ref 11.5–15.5)
RDW: 13.1 % (ref 11.5–15.5)
WBC: 16.6 10*3/uL — ABNORMAL HIGH (ref 4.0–10.5)
WBC: 20 10*3/uL — ABNORMAL HIGH (ref 4.0–10.5)
nRBC: 0 % (ref 0.0–0.2)
nRBC: 0 % (ref 0.0–0.2)

## 2018-12-06 LAB — URINALYSIS, ROUTINE W REFLEX MICROSCOPIC
Bacteria, UA: NONE SEEN
Bilirubin Urine: NEGATIVE
Glucose, UA: NEGATIVE mg/dL
Ketones, ur: 20 mg/dL — AB
Leukocytes,Ua: NEGATIVE
Nitrite: NEGATIVE
Protein, ur: NEGATIVE mg/dL
Specific Gravity, Urine: >1.046 — ABNORMAL HIGH (ref 1.005–1.030)
pH: 6 (ref 5.0–8.0)

## 2018-12-06 LAB — COMPREHENSIVE METABOLIC PANEL
ALT: 20 U/L (ref 0–44)
ALT: 18 U/L (ref 0–44)
AST: 21 U/L (ref 15–41)
AST: 18 U/L (ref 15–41)
Albumin: 4 g/dL (ref 3.5–5.0)
Albumin: 3.3 g/dL — ABNORMAL LOW (ref 3.5–5.0)
Alkaline Phosphatase: 101 U/L (ref 38–126)
Alkaline Phosphatase: 94 U/L (ref 38–126)
Anion gap: 14 (ref 5–15)
Anion gap: 12 (ref 5–15)
BUN: 7 mg/dL (ref 6–20)
BUN: 6 mg/dL (ref 6–20)
CO2: 21 mmol/L — ABNORMAL LOW (ref 22–32)
CO2: 21 mmol/L — ABNORMAL LOW (ref 22–32)
Calcium: 9 mg/dL (ref 8.9–10.3)
Calcium: 8.2 mg/dL — ABNORMAL LOW (ref 8.9–10.3)
Chloride: 100 mmol/L (ref 98–111)
Chloride: 104 mmol/L (ref 98–111)
Creatinine, Ser: 1.04 mg/dL — ABNORMAL HIGH (ref 0.44–1.00)
Creatinine, Ser: 0.92 mg/dL (ref 0.44–1.00)
GFR calc Af Amer: >60 mL/min (ref >60)
GFR calc Af Amer: >60 mL/min (ref >60)
GFR calc non Af Amer: >60 mL/min (ref >60)
GFR calc non Af Amer: >60 mL/min (ref >60)
Glucose, Bld: 124 mg/dL — ABNORMAL HIGH (ref 70–99)
Glucose, Bld: 123 mg/dL — ABNORMAL HIGH (ref 70–99)
Potassium: 3.4 mmol/L — ABNORMAL LOW (ref 3.5–5.1)
Potassium: 3.7 mmol/L (ref 3.5–5.1)
Sodium: 135 mmol/L (ref 135–145)
Sodium: 137 mmol/L (ref 135–145)
Total Bilirubin: 1.1 mg/dL (ref 0.3–1.2)
Total Bilirubin: 1.2 mg/dL (ref 0.3–1.2)
Total Protein: 7.9 g/dL (ref 6.5–8.1)
Total Protein: 6.5 g/dL (ref 6.5–8.1)

## 2018-12-06 LAB — SARS CORONAVIRUS 2 BY RT PCR (HOSPITAL ORDER, PERFORMED IN ~~LOC~~ HOSPITAL LAB): SARS Coronavirus 2: NEGATIVE

## 2018-12-06 LAB — SURGICAL PCR SCREEN
MRSA, PCR: NEGATIVE
Staphylococcus aureus: NEGATIVE

## 2018-12-06 LAB — I-STAT BETA HCG BLOOD, ED (MC, WL, AP ONLY): I-stat hCG, quantitative: <5 m[IU]/mL (ref <5)

## 2018-12-06 LAB — LIPASE, BLOOD: Lipase: 24 U/L (ref 11–51)

## 2018-12-06 LAB — LACTIC ACID, PLASMA
Lactic Acid, Venous: 1.4 mmol/L (ref 0.5–1.9)
Lactic Acid, Venous: 2 mmol/L (ref 0.5–1.9)

## 2018-12-06 SURGERY — APPENDECTOMY, LAPAROSCOPIC
Anesthesia: General

## 2018-12-06 MED ORDER — LACTATED RINGERS IV SOLN
INTRAVENOUS | Status: DC | PRN
  Administered 2018-12-06 (×2): via INTRAVENOUS

## 2018-12-06 MED ORDER — FENTANYL CITRATE (PF) 100 MCG/2ML IJ SOLN
25.0000 ug | INTRAMUSCULAR | Status: DC | PRN
  Administered 2018-12-06 (×2): 25 ug via INTRAVENOUS

## 2018-12-06 MED ORDER — METHOCARBAMOL 500 MG PO TABS
500.0000 mg | ORAL_TABLET | Freq: Four times a day (QID) | ORAL | Status: DC | PRN
  Administered 2018-12-07: 500 mg via ORAL
  Filled 2018-12-06: qty 1

## 2018-12-06 MED ORDER — ONDANSETRON HCL 4 MG/2ML IJ SOLN
INTRAMUSCULAR | Status: DC | PRN
  Administered 2018-12-06: 4 mg via INTRAVENOUS

## 2018-12-06 MED ORDER — LIDOCAINE 2% (20 MG/ML) 5 ML SYRINGE
INTRAMUSCULAR | Status: DC | PRN
  Administered 2018-12-06: 100 mg via INTRAVENOUS

## 2018-12-06 MED ORDER — KCL IN DEXTROSE-NACL 20-5-0.9 MEQ/L-%-% IV SOLN
INTRAVENOUS | Status: DC
  Administered 2018-12-06 – 2018-12-07 (×2): via INTRAVENOUS
  Filled 2018-12-06 (×4): qty 1000

## 2018-12-06 MED ORDER — KETOROLAC TROMETHAMINE 15 MG/ML IJ SOLN
15.0000 mg | Freq: Four times a day (QID) | INTRAMUSCULAR | Status: AC
  Administered 2018-12-06: 20:00:00 15 mg via INTRAVENOUS

## 2018-12-06 MED ORDER — FENTANYL CITRATE (PF) 100 MCG/2ML IJ SOLN
INTRAMUSCULAR | Status: AC
  Administered 2018-12-06: 25 ug via INTRAVENOUS
  Filled 2018-12-06: qty 2

## 2018-12-06 MED ORDER — 0.9 % SODIUM CHLORIDE (POUR BTL) OPTIME
TOPICAL | Status: DC | PRN
  Administered 2018-12-06: 1000 mL

## 2018-12-06 MED ORDER — FENTANYL CITRATE (PF) 250 MCG/5ML IJ SOLN
INTRAMUSCULAR | Status: DC | PRN
  Administered 2018-12-06 (×2): 100 ug via INTRAVENOUS
  Administered 2018-12-06: 50 ug via INTRAVENOUS

## 2018-12-06 MED ORDER — SCOPOLAMINE 1 MG/3DAYS TD PT72
MEDICATED_PATCH | TRANSDERMAL | Status: AC
  Filled 2018-12-06: qty 1

## 2018-12-06 MED ORDER — SODIUM CHLORIDE 0.9 % IR SOLN
Status: DC | PRN
  Administered 2018-12-06: 1000 mL

## 2018-12-06 MED ORDER — BUPIVACAINE-EPINEPHRINE 0.5% -1:200000 IJ SOLN
INTRAMUSCULAR | Status: DC | PRN
  Administered 2018-12-06: 9 mL

## 2018-12-06 MED ORDER — CHLORHEXIDINE GLUCONATE CLOTH 2 % EX PADS: 6.0000 | MEDICATED_PAD | Freq: Once | CUTANEOUS | Status: DC

## 2018-12-06 MED ORDER — ONDANSETRON 4 MG PO TBDP: 4.0000 mg | ORAL_TABLET | Freq: Four times a day (QID) | ORAL | Status: DC | PRN

## 2018-12-06 MED ORDER — MIDAZOLAM HCL 2 MG/2ML IJ SOLN
INTRAMUSCULAR | Status: DC | PRN
  Administered 2018-12-06: 2 mg via INTRAVENOUS

## 2018-12-06 MED ORDER — CIPROFLOXACIN IN D5W 400 MG/200ML IV SOLN
400.0000 mg | Freq: Once | INTRAVENOUS | Status: AC
  Administered 2018-12-06: 400 mg via INTRAVENOUS
  Filled 2018-12-06: qty 200

## 2018-12-06 MED ORDER — ROCURONIUM BROMIDE 10 MG/ML (PF) SYRINGE
PREFILLED_SYRINGE | INTRAVENOUS | Status: DC | PRN
  Administered 2018-12-06: 60 mg via INTRAVENOUS

## 2018-12-06 MED ORDER — LIDOCAINE 2% (20 MG/ML) 5 ML SYRINGE
INTRAMUSCULAR | Status: AC
  Filled 2018-12-06: qty 5

## 2018-12-06 MED ORDER — SCOPOLAMINE 1 MG/3DAYS TD PT72
MEDICATED_PATCH | TRANSDERMAL | Status: DC | PRN
  Administered 2018-12-06: 1 via TRANSDERMAL

## 2018-12-06 MED ORDER — ONDANSETRON HCL 4 MG/2ML IJ SOLN
INTRAMUSCULAR | Status: AC
  Filled 2018-12-06: qty 2

## 2018-12-06 MED ORDER — MIDAZOLAM HCL 2 MG/2ML IJ SOLN
INTRAMUSCULAR | Status: AC
  Filled 2018-12-06: qty 2

## 2018-12-06 MED ORDER — KETOROLAC TROMETHAMINE 15 MG/ML IJ SOLN: 15.0000 mg | Freq: Four times a day (QID) | INTRAMUSCULAR | Status: DC | PRN

## 2018-12-06 MED ORDER — PROMETHAZINE HCL 25 MG/ML IJ SOLN
6.2500 mg | INTRAMUSCULAR | Status: DC | PRN
  Administered 2018-12-06: 6.25 mg via INTRAVENOUS

## 2018-12-06 MED ORDER — IOHEXOL 300 MG/ML  SOLN
100.0000 mL | Freq: Once | INTRAMUSCULAR | Status: AC | PRN
  Administered 2018-12-06: 100 mL via INTRAVENOUS

## 2018-12-06 MED ORDER — CIPROFLOXACIN IN D5W 400 MG/200ML IV SOLN
400.0000 mg | INTRAVENOUS | Status: DC
  Filled 2018-12-06 (×2): qty 200

## 2018-12-06 MED ORDER — BUPIVACAINE HCL (PF) 0.25 % IJ SOLN
INTRAMUSCULAR | Status: AC
  Filled 2018-12-06: qty 30

## 2018-12-06 MED ORDER — METRONIDAZOLE IN NACL 5-0.79 MG/ML-% IV SOLN
500.0000 mg | Freq: Once | INTRAVENOUS | Status: AC
  Administered 2018-12-06: 500 mg via INTRAVENOUS
  Filled 2018-12-06: qty 100

## 2018-12-06 MED ORDER — KETOROLAC TROMETHAMINE 15 MG/ML IJ SOLN
INTRAMUSCULAR | Status: AC
  Administered 2018-12-06: 15 mg via INTRAVENOUS
  Filled 2018-12-06: qty 1

## 2018-12-06 MED ORDER — SUGAMMADEX SODIUM 200 MG/2ML IV SOLN
INTRAVENOUS | Status: DC | PRN
  Administered 2018-12-06: 200 mg via INTRAVENOUS

## 2018-12-06 MED ORDER — OXYCODONE HCL 5 MG PO TABS
5.0000 mg | ORAL_TABLET | ORAL | Status: DC | PRN
  Administered 2018-12-07: 10 mg via ORAL
  Filled 2018-12-06 (×2): qty 2

## 2018-12-06 MED ORDER — PROPOFOL 10 MG/ML IV BOLUS
INTRAVENOUS | Status: DC | PRN
  Administered 2018-12-06: 200 mg via INTRAVENOUS

## 2018-12-06 MED ORDER — SODIUM CHLORIDE 0.9 % IV BOLUS
1000.0000 mL | Freq: Once | INTRAVENOUS | Status: AC
  Administered 2018-12-06: 1000 mL via INTRAVENOUS

## 2018-12-06 MED ORDER — ACETAMINOPHEN 500 MG PO TABS
1000.0000 mg | ORAL_TABLET | Freq: Once | ORAL | Status: AC
  Administered 2018-12-06: 1000 mg via ORAL
  Filled 2018-12-06: qty 2

## 2018-12-06 MED ORDER — ENOXAPARIN SODIUM 40 MG/0.4ML ~~LOC~~ SOLN
40.0000 mg | SUBCUTANEOUS | Status: DC
  Administered 2018-12-07: 40 mg via SUBCUTANEOUS
  Filled 2018-12-06: qty 0.4

## 2018-12-06 MED ORDER — ONDANSETRON HCL 4 MG/2ML IJ SOLN
4.0000 mg | Freq: Four times a day (QID) | INTRAMUSCULAR | Status: DC | PRN
  Administered 2018-12-06 – 2018-12-07 (×3): 4 mg via INTRAVENOUS
  Filled 2018-12-06 (×4): qty 2

## 2018-12-06 MED ORDER — PROMETHAZINE HCL 25 MG/ML IJ SOLN
INTRAMUSCULAR | Status: AC
  Filled 2018-12-06: qty 1

## 2018-12-06 MED ORDER — ACETAMINOPHEN 10 MG/ML IV SOLN
INTRAVENOUS | Status: AC
  Filled 2018-12-06: qty 100

## 2018-12-06 MED ORDER — FENTANYL CITRATE (PF) 250 MCG/5ML IJ SOLN
INTRAMUSCULAR | Status: AC
  Filled 2018-12-06: qty 5

## 2018-12-06 MED ORDER — BUPIVACAINE-EPINEPHRINE (PF) 0.5% -1:200000 IJ SOLN
INTRAMUSCULAR | Status: AC
  Filled 2018-12-06: qty 30

## 2018-12-06 MED ORDER — DEXAMETHASONE SODIUM PHOSPHATE 10 MG/ML IJ SOLN
INTRAMUSCULAR | Status: DC | PRN
  Administered 2018-12-06: 10 mg via INTRAVENOUS

## 2018-12-06 MED ORDER — DEXAMETHASONE SODIUM PHOSPHATE 10 MG/ML IJ SOLN
INTRAMUSCULAR | Status: AC
  Filled 2018-12-06: qty 1

## 2018-12-06 MED ORDER — ONDANSETRON HCL 4 MG/2ML IJ SOLN
4.0000 mg | Freq: Once | INTRAMUSCULAR | Status: AC
  Administered 2018-12-06: 4 mg via INTRAVENOUS
  Filled 2018-12-06: qty 2

## 2018-12-06 MED ORDER — FENTANYL CITRATE (PF) 100 MCG/2ML IJ SOLN
25.0000 ug | INTRAMUSCULAR | Status: DC | PRN
  Administered 2018-12-06: 25 ug via INTRAVENOUS
  Filled 2018-12-06: qty 2

## 2018-12-06 MED ORDER — ROCURONIUM BROMIDE 10 MG/ML (PF) SYRINGE
PREFILLED_SYRINGE | INTRAVENOUS | Status: AC
  Filled 2018-12-06: qty 10

## 2018-12-06 MED ORDER — MORPHINE SULFATE (PF) 4 MG/ML IV SOLN
4.0000 mg | Freq: Once | INTRAVENOUS | Status: AC
  Administered 2018-12-06: 4 mg via INTRAVENOUS
  Filled 2018-12-06: qty 1

## 2018-12-06 SURGICAL SUPPLY — 48 items
ADH SKN CLS APL DERMABOND .7 (GAUZE/BANDAGES/DRESSINGS) ×1
APL PRP STRL LF DISP 70% ISPRP (MISCELLANEOUS) ×1
APPLIER CLIP ROT 10 11.4 M/L (STAPLE)
APR CLP MED LRG 11.4X10 (STAPLE)
BAG SPEC RTRVL 10 TROC 200 (ENDOMECHANICALS) ×1
BLADE CLIPPER SURG (BLADE) IMPLANT
CANISTER SUCT 3000ML PPV (MISCELLANEOUS) ×2 IMPLANT
CHLORAPREP W/TINT 26 (MISCELLANEOUS) ×2 IMPLANT
CLIP APPLIE ROT 10 11.4 M/L (STAPLE) IMPLANT
COVER SURGICAL LIGHT HANDLE (MISCELLANEOUS) ×2 IMPLANT
COVER WAND RF STERILE (DRAPES) ×2 IMPLANT
CUTTER FLEX LINEAR 45M (STAPLE) ×2 IMPLANT
DERMABOND ADVANCED (GAUZE/BANDAGES/DRESSINGS) ×1
DERMABOND ADVANCED .7 DNX12 (GAUZE/BANDAGES/DRESSINGS) ×1 IMPLANT
DRAPE WARM FLUID 44X44 (DRAPES) ×2 IMPLANT
ELECT REM PT RETURN 9FT ADLT (ELECTROSURGICAL) ×2
ELECTRODE REM PT RTRN 9FT ADLT (ELECTROSURGICAL) ×1 IMPLANT
ENDOLOOP SUT PDS II  0 18 (SUTURE)
ENDOLOOP SUT PDS II 0 18 (SUTURE) IMPLANT
GLOVE BIO SURGEON STRL SZ8 (GLOVE) ×2 IMPLANT
GLOVE BIOGEL PI IND STRL 8 (GLOVE) ×1 IMPLANT
GLOVE BIOGEL PI INDICATOR 8 (GLOVE) ×1
GOWN STRL REUS W/ TWL LRG LVL3 (GOWN DISPOSABLE) ×2 IMPLANT
GOWN STRL REUS W/ TWL XL LVL3 (GOWN DISPOSABLE) ×1 IMPLANT
GOWN STRL REUS W/TWL LRG LVL3 (GOWN DISPOSABLE) ×4
GOWN STRL REUS W/TWL XL LVL3 (GOWN DISPOSABLE) ×2
KIT BASIN OR (CUSTOM PROCEDURE TRAY) ×2 IMPLANT
KIT TURNOVER KIT B (KITS) ×2 IMPLANT
NS IRRIG 1000ML POUR BTL (IV SOLUTION) ×2 IMPLANT
PAD ARMBOARD 7.5X6 YLW CONV (MISCELLANEOUS) ×4 IMPLANT
POUCH RETRIEVAL ECOSAC 10 (ENDOMECHANICALS) ×1 IMPLANT
POUCH RETRIEVAL ECOSAC 10MM (ENDOMECHANICALS) ×1
RELOAD STAPLE 45 3.5 BLU ETS (ENDOMECHANICALS) ×1 IMPLANT
RELOAD STAPLE TA45 3.5 REG BLU (ENDOMECHANICALS) ×2 IMPLANT
SCISSORS LAP 5X35 DISP (ENDOMECHANICALS) ×2 IMPLANT
SET IRRIG TUBING LAPAROSCOPIC (IRRIGATION / IRRIGATOR) ×2 IMPLANT
SET TUBE SMOKE EVAC HIGH FLOW (TUBING) ×2 IMPLANT
SHEARS HARMONIC ACE PLUS 36CM (ENDOMECHANICALS) ×2 IMPLANT
SPECIMEN JAR SMALL (MISCELLANEOUS) ×2 IMPLANT
SUT MON AB 4-0 PC3 18 (SUTURE) ×2 IMPLANT
TOWEL GREEN STERILE (TOWEL DISPOSABLE) ×2 IMPLANT
TOWEL GREEN STERILE FF (TOWEL DISPOSABLE) ×2 IMPLANT
TRAY FOLEY W/BAG SLVR 16FR (SET/KITS/TRAYS/PACK) ×2
TRAY FOLEY W/BAG SLVR 16FR ST (SET/KITS/TRAYS/PACK) ×1 IMPLANT
TRAY LAPAROSCOPIC MC (CUSTOM PROCEDURE TRAY) ×2 IMPLANT
TROCAR XCEL BLADELESS 5X75MML (TROCAR) ×4 IMPLANT
TROCAR XCEL BLUNT TIP 100MML (ENDOMECHANICALS) ×2 IMPLANT
WATER STERILE IRR 1000ML POUR (IV SOLUTION) ×2 IMPLANT

## 2018-12-06 NOTE — Anesthesia Preprocedure Evaluation (Addendum)
Anesthesia Evaluation  Patient identified by MRN, date of birth, ID band Patient awake    Reviewed: Allergy & Precautions, NPO status , Patient's Chart, lab work & pertinent test results  History of Anesthesia Complications Negative for: history of anesthetic complications  Airway Mallampati: III  TM Distance: >3 FB Neck ROM: Full    Dental no notable dental hx. (+) Dental Advisory Given   Pulmonary neg pulmonary ROS,    Pulmonary exam normal        Cardiovascular negative cardio ROS Normal cardiovascular exam     Neuro/Psych negative neurological ROS     GI/Hepatic negative GI ROS, Neg liver ROS,   Endo/Other  negative endocrine ROS  Renal/GU negative Renal ROS     Musculoskeletal negative musculoskeletal ROS (+)   Abdominal   Peds  Hematology negative hematology ROS (+)   Anesthesia Other Findings Day of surgery medications reviewed with the patient.  Reproductive/Obstetrics                            Anesthesia Physical Anesthesia Plan  ASA: II and emergent  Anesthesia Plan: General   Post-op Pain Management:    Induction: Intravenous, Rapid sequence and Cricoid pressure planned  PONV Risk Score and Plan: 3 and Ondansetron, Dexamethasone and Scopolamine patch - Pre-op  Airway Management Planned: Oral ETT  Additional Equipment:   Intra-op Plan:   Post-operative Plan: Extubation in OR  Informed Consent: I have reviewed the patients History and Physical, chart, labs and discussed the procedure including the risks, benefits and alternatives for the proposed anesthesia with the patient or authorized representative who has indicated his/her understanding and acceptance.       Plan Discussed with: CRNA, Anesthesiologist and Surgeon  Anesthesia Plan Comments:        Anesthesia Quick Evaluation

## 2018-12-06 NOTE — ED Triage Notes (Signed)
Pt reports abdominal pain x 2 days, fever and vomiting x 1 day.

## 2018-12-06 NOTE — ED Triage Notes (Signed)
Pt presents from urgent care for abd CT. Pt has been throwing up (now only dry heaves), nausea, experiencing intense RLQ abd pain and febrile for 24hrs (highest 100.7, has not taken tylenol today due to constant vomiting), has not been able to keep anything down PO. Denies diarrhea, HA.   Pt was swabbed for covid at urgent care.

## 2018-12-06 NOTE — Discharge Instructions (Signed)
Go to the Emergency Department for evaluation of your right lower abdominal pain.     

## 2018-12-06 NOTE — ED Provider Notes (Signed)
[facility]ENC[facility] Provider Note   CSN: [id number] Arrival date & time: 12/06/18  1130     History   Chief Complaint Chief Complaint  Patient presents with  . Abdominal Pain    HPI Angela Joseph is a 33 y.o. female who presents for evaluation of 3 days of right lower quadrant abdominal pain and nausea/vomiting that began yesterday.  Patient reports that her abdominal pain has worsened over the last 3 days.  She additionally reports that since yesterday, she has not been able to tolerate any p.o.  No blood noted in emesis.  She reports pain is been constant and describes it as a sharp pain.  It is worse with movement.  She states she noticed a low-grade fever yesterday ranging between 99-100.7.  She went to urgent care today for evaluation of symptoms and was told to go to the emergency department for further evaluation.  Reports she is currently on her menstrual cycle.  Denies any dysuria, chest pain, difficulty breathing.      The history is provided by the patient.    Past Medical History:  Diagnosis Date  . Allergy   . Migraine     There are no active problems to display for this patient.   Past Surgical History:  Procedure Laterality Date  . DENTAL SURGERY    . KNEE SURGERY       OB History    Gravida  0   Para  0   Term  0   Preterm  0   AB  0   Living  0     SAB  0   TAB  0   Ectopic  0   Multiple  0   Live Births  0            Home Medications    Prior to Admission medications   Medication Sig Start Date End Date Taking? Authorizing Provider  BLISOVI 24 FE 1-20 MG-MCG(24) tablet Take 1 tablet by mouth daily. 01/14/18  Yes [provider]  cholecalciferol (VITAMIN D) 1000 units tablet Take 800 Units by mouth daily.    Yes [provider]  ibuprofen (ADVIL,MOTRIN) 200 MG tablet Take 400 mg by mouth daily as needed for moderate pain.   Yes [provider]  hydrOXYzine  (ATARAX/VISTARIL) 25 MG tablet Take 1 tablet (25 mg total) by mouth every 8 (eight) hours as needed for itching. Patient not taking: Reported on 12/06/2018 09/22/18   Wallis BambergMani, Mario, PA-C  predniSONE (DELTASONE) 20 MG tablet Take 2 tablets daily with breakfast. Patient not taking: Reported on 12/06/2018 09/22/18   Wallis BambergMani, Mario, PA-C  traMADol (ULTRAM) 50 MG tablet Take 1 tablet (50 mg total) by mouth every 6 (six) hours as needed. Patient not taking: Reported on 09/22/2018 04/02/18   Garlon HatchetSanders, Lisa M, PA-C    Family History Family History  Problem Relation Age of Onset  . Hypertension Father   . Cancer Paternal Grandmother     Social History Social History   Tobacco Use  . Smoking status: Never Smoker  . Smokeless tobacco: Never Used  Substance Use Topics  . Alcohol use: Yes  . Drug use: No     Allergies   Tamiflu [oseltamivir phosphate], Bee venom, Ceclor [cefaclor], Cleocin [clindamycin hcl], Other, Penicillins, and Zinc   Review of Systems Review of Systems  Constitutional: Positive for appetite change and fever.  Respiratory: Negative for cough and shortness of breath.   Cardiovascular: Negative  for chest pain.  Gastrointestinal: Positive for abdominal pain, nausea and vomiting.  Genitourinary: Negative for dysuria and hematuria.  Neurological: Negative for headaches.  All other systems reviewed and are negative.    Physical Exam Updated Vital Signs BP 134/81   Pulse 80   Temp (!) 100.4 F (38 C) (Oral)   Resp 20   Ht 5\' 6"  (1.676 m)   Wt 90.7 kg   LMP 12/05/2018   SpO2 97%   BMI 32.28 kg/m   Physical Exam Vitals signs and nursing note reviewed.  Constitutional:      Appearance: Normal appearance. She is well-developed.     Comments: Appears uncomfortable  HENT:     Head: Normocephalic and atraumatic.  Eyes:     General: Lids are normal.     Conjunctiva/sclera: Conjunctivae normal.     Pupils: Pupils are equal, round, and reactive to light.  Neck:      Musculoskeletal: Full passive range of motion without pain.  Cardiovascular:     Rate and Rhythm: Regular rhythm. Tachycardia present.     Pulses: Normal pulses.     Heart sounds: Normal heart sounds. No murmur. No friction rub. No gallop.   Pulmonary:     Effort: Pulmonary effort is normal.     Breath sounds: Normal breath sounds.     Comments: Lungs clear to auscultation bilaterally.  Symmetric chest rise.  No wheezing, rales, rhonchi. Abdominal:     Palpations: Abdomen is soft. Abdomen is not rigid.     Tenderness: There is abdominal tenderness in the right lower quadrant. There is no guarding. Positive signs include McBurney's sign.     Comments: Abdomen is soft, nondistended.  Tenderness noted focally at McBurney's point.  No CVA tenderness.  No rigidity.  Musculoskeletal: Normal range of motion.  Skin:    General: Skin is warm and dry.     Capillary Refill: Capillary refill takes less than 2 seconds.  Neurological:     Mental Status: She is alert and oriented to person, place, and time.  Psychiatric:        Speech: Speech normal.      ED Treatments / Results  Labs (all labs ordered are listed, but only abnormal results are displayed) Labs Reviewed  COMPREHENSIVE METABOLIC PANEL - Abnormal; Notable for the following components:      Result Value   Potassium 3.4 (*)    CO2 21 (*)    Glucose, Bld 124 (*)    Creatinine, Ser 1.04 (*)    All other components within normal limits  CBC WITH DIFFERENTIAL/PLATELET - Abnormal; Notable for the following components:   WBC 16.6 (*)    Neutro Abs 13.9 (*)    Abs Immature Granulocytes 0.10 (*)    All other components within normal limits  SARS CORONAVIRUS 2 BY RT PCR (HOSPITAL ORDER, PERFORMED IN [facility])  LIPASE, BLOOD  LACTIC ACID, PLASMA  URINALYSIS, ROUTINE W REFLEX MICROSCOPIC  LACTIC ACID, PLASMA  I-STAT BETA HCG BLOOD, ED ([facility], [facility], [facility] ONLY)    EKG None  Radiology Ct Abdomen Pelvis W Contrast  Result  Date: 12/06/2018 CLINICAL DATA:  Right lower quadrant pain with low grade fever and chills for 2 days. Elevated white blood cell count. EXAM: CT ABDOMEN AND PELVIS WITH CONTRAST TECHNIQUE: Multidetector CT imaging of the abdomen and pelvis was performed using the standard protocol following bolus administration of intravenous contrast. CONTRAST:  100 mL OMNIPAQUE IOHEXOL 300 MG/ML  SOLN COMPARISON:  CT abdomen and pelvis 04/02/2018. FINDINGS: Lower chest: Mild dependent atelectasis. No pleural or pericardial effusion. Hepatobiliary: No focal liver abnormality is seen. No gallstones, gallbladder wall thickening, or biliary dilatation. Pancreas: Unremarkable. No pancreatic ductal dilatation or surrounding inflammatory changes. Spleen: Normal in size without focal abnormality. Adrenals/Urinary Tract: Adrenal glands are unremarkable. Kidneys are normal, without renal calculi, focal lesion, or hydronephrosis. Bladder is unremarkable. Stomach/Bowel: The appendix is dilated with periappendiceal stranding consistent with acute appendicitis. No appendicolith. Stomach and small and large bowel appear normal. Vascular/Lymphatic: No significant vascular findings are present. No enlarged abdominal or pelvic lymph nodes. Reproductive: Uterus and bilateral adnexa are unremarkable. Other: None. Musculoskeletal: Negative. IMPRESSION: Acute appendicitis. Appendix: Location: Inferior to the cecum and posterior to the distal ileum. Diameter: 1.2 cm Appendicolith: Negative. Mucosal hyper-enhancement: Positive. Extraluminal gas: Negative. Periappendiceal collection: Negative. These results were called by telephone at the time of interpretation on 12/06/2018 at 2:19 pm to provider [facility] , who verbally acknowledged these results. Electronically Signed   By: Inge Rise M.D.   On: 12/06/2018 14:20    Procedures Procedures (including critical care time)  Medications Ordered in ED Medications  ciprofloxacin (CIPRO) IVPB  400 mg (400 mg Intravenous New Bag/Given 12/06/18 1441)    And  metroNIDAZOLE (FLAGYL) IVPB 500 mg (500 mg Intravenous New Bag/Given 12/06/18 1445)  sodium chloride 0.9 % bolus 1,000 mL (1,000 mLs Intravenous New Bag/Given 12/06/18 1231)  ondansetron (ZOFRAN) injection 4 mg (4 mg Intravenous Given 12/06/18 1225)  morphine 4 MG/ML injection 4 mg (4 mg Intravenous Given 12/06/18 1227)  acetaminophen (TYLENOL) tablet 1,000 mg (1,000 mg Oral Given 12/06/18 1243)  iohexol (OMNIPAQUE) 300 MG/ML solution 100 mL (100 mLs Intravenous Contrast Given 12/06/18 1337)     Initial Impression / Assessment and Plan / ED Course  I have reviewed the triage vital signs and the nursing notes.  Pertinent labs & imaging results that were available during my care of the patient were reviewed by me and considered in my medical decision making (see chart for details).        33 year old female who presents for evaluation of 3 days of progressive worsening abdominal pain associated with nausea/vomiting, decreased appetite, fevers.  Initial ED arrival, she is febrile, tachycardic and slightly hypertensive.  On exam, she has focal tenderness noted to McBurney's point.  Concern for appendicitis.  She states she is currently on her menstrual cycle.  Also consider ectopic.  Consider infectious etiology.  CBC with leukocytosis of 16.6. I-stat beta negative.  CMP shows potassium of 3.4, bicarb of 21, creatinine of 1.04. Lactic is within normal limits.   CT abd/pelvis shows acute appendicitis.  Will start patient on antibiotics and consult and surgery.  Discussed patient with Dr. Brantley Stage (Gen Surg) who will come evaluate patient in the ED. He will admit.   Portions of this note were generated with [employer]. Dictation errors may occur despite best attempts at proofreading.   Final Clinical Impressions(s) / ED Diagnoses   Final diagnoses:  Acute appendicitis, unspecified acute appendicitis type     ED Discharge Orders    None       Volanda Napoleon, PA-C 12/06/18 1453    Charlesetta Shanks, MD 12/07/18 [phone]

## 2018-12-06 NOTE — Progress Notes (Signed)
CRITICAL VALUE ALERT  Critical Value:  Lactic acid 2.0  Date & Time Notied:  12/06/2018 2100  Provider Notified: yes through text message  Orders Received/Actions taken:

## 2018-12-06 NOTE — H&P (Signed)
Angela Joseph is an 33 y.o. female.   Chief Complaint: Abdominal pain HPI: Patient presents emergency room with a 2-day history of lower abdominal pain.  Pain is crampy in nature.  The pain started 2 days ago.  She has had a low-grade fever as well.  She has had associated nausea and vomiting.  Pain is made worse when the area is pushed on.  She complains most of her pains in her right lower abdomen without radiation.  Denies dysuria or blood in her urine.  CT scan shows acute appendicitis.  Past Medical History:  Diagnosis Date  . Allergy   . Migraine     Past Surgical History:  Procedure Laterality Date  . DENTAL SURGERY    . KNEE SURGERY      Family History  Problem Relation Age of Onset  . Hypertension Father   . Cancer Paternal Grandmother    Social History:  reports that she has never smoked. She has never used smokeless tobacco. She reports current alcohol use. She reports that she does not use drugs.  Allergies:  Allergies  Allergen Reactions  . Tamiflu [Oseltamivir Phosphate]   . Bee Venom Swelling    Yellow jacket  . Ceclor [Cefaclor] Rash and Other (See Comments)    High fever and rash   . Cleocin [Clindamycin Hcl] Rash and Other (See Comments)    Fever and rash in childhood  . Other Rash and Other (See Comments)    All cilins and mycins causes rash and high fever at childhood   . Penicillins Rash and Other (See Comments)    Fever and rash in childhood Did it involve swelling of the face/tongue/throat, SOB, or low BP? Y Did it involve sudden or severe rash/hives, skin peeling, or any reaction on the inside of your mouth or nose? UNK Did you need to seek medical attention at a hospital or doctor's office? UNK When did it last happen?more than 10 years If all above answers are "NO", may proceed with cephalosporin use.   Marland Kitchen Zinc Rash    Zinc oxide    (Not in a hospital admission)   Results for orders placed or performed during the hospital encounter  of 12/06/18 (from the past 48 hour(s))  Comprehensive metabolic panel     Status: Abnormal   Collection Time: 12/06/18 11:46 AM  Result Value Ref Range   Sodium 135 135 - 145 mmol/L   Potassium 3.4 (L) 3.5 - 5.1 mmol/L   Chloride 100 98 - 111 mmol/L   CO2 21 (L) 22 - 32 mmol/L   Glucose, Bld 124 (H) 70 - 99 mg/dL   BUN 7 6 - 20 mg/dL   Creatinine, Ser 1.04 (H) 0.44 - 1.00 mg/dL   Calcium 9.0 8.9 - 10.3 mg/dL   Total Protein 7.9 6.5 - 8.1 g/dL   Albumin 4.0 3.5 - 5.0 g/dL   AST 21 15 - 41 U/L   ALT 20 0 - 44 U/L   Alkaline Phosphatase 101 38 - 126 U/L   Total Bilirubin 1.1 0.3 - 1.2 mg/dL   GFR calc non Af Amer >60 >60 mL/min   GFR calc Af Amer >60 >60 mL/min   Anion gap 14 5 - 15    Comment: Performed at [facility], [address], [place], [place] [address]  CBC with Differential     Status: Abnormal   Collection Time: 12/06/18 11:46 AM  Result Value Ref Range   WBC 16.6 (H)  4.0 - 10.5 K/uL   RBC 5.05 3.87 - 5.11 MIL/uL   Hemoglobin 14.7 12.0 - 15.0 g/dL   HCT 00.3 70.4 - 88.8 %   MCV 85.5 80.0 - 100.0 fL   MCH 29.1 26.0 - 34.0 pg   MCHC 34.0 30.0 - 36.0 g/dL   RDW 91.6 94.5 - 03.8 %   Platelets 366 150 - 400 K/uL   nRBC 0.0 0.0 - 0.2 %   Neutrophils Relative % 83 %   Neutro Abs 13.9 (H) 1.7 - 7.7 K/uL   Lymphocytes Relative 9 %   Lymphs Abs 1.5 0.7 - 4.0 K/uL   Monocytes Relative 6 %   Monocytes Absolute 1.0 0.1 - 1.0 K/uL   Eosinophils Relative 1 %   Eosinophils Absolute 0.1 0.0 - 0.5 K/uL   Basophils Relative 0 %   Basophils Absolute 0.0 0.0 - 0.1 K/uL   Immature Granulocytes 1 %   Abs Immature Granulocytes 0.10 (H) 0.00 - 0.07 K/uL    Comment: Performed at [facility], [address], [place], [place] [address]  Lipase, blood     Status: None   Collection Time: 12/06/18 11:46 AM  Result Value Ref Range   Lipase 24 11 - 51 U/L    Comment: Performed at [facility], [address] [place], [place] [address]  I-Stat beta hCG blood, ED      Status: None   Collection Time: 12/06/18 12:05 PM  Result Value Ref Range   I-stat hCG, quantitative <5.0 <5 mIU/mL   Comment 3            Comment:   GEST. AGE      CONC.  (mIU/mL)   <=1 WEEK        5 - 50     2 WEEKS       50 - 500     3 WEEKS       100 - 10,000     4 WEEKS     1,000 - 30,000        FEMALE AND NON-PREGNANT FEMALE:     LESS THAN 5 mIU/mL   Lactic acid, plasma     Status: None   Collection Time: 12/06/18 12:30 PM  Result Value Ref Range   Lactic Acid, Venous 1.4 0.5 - 1.9 mmol/L    Comment: Performed at [facility], [address], [place], [place] [address]   Ct Abdomen Pelvis W Contrast  Result Date: 12/06/2018 CLINICAL DATA:  Right lower quadrant pain with low grade fever and chills for 2 days. Elevated white blood cell count. EXAM: CT ABDOMEN AND PELVIS WITH CONTRAST TECHNIQUE: Multidetector CT imaging of the abdomen and pelvis was performed using the standard protocol following bolus administration of intravenous contrast. CONTRAST:  100 mL OMNIPAQUE IOHEXOL 300 MG/ML  SOLN COMPARISON:  CT abdomen and pelvis 04/02/2018. FINDINGS: Lower chest: Mild dependent atelectasis. No pleural or pericardial effusion. Hepatobiliary: No focal liver abnormality is seen. No gallstones, gallbladder wall thickening, or biliary dilatation. Pancreas: Unremarkable. No pancreatic ductal dilatation or surrounding inflammatory changes. Spleen: Normal in size without focal abnormality. Adrenals/Urinary Tract: Adrenal glands are unremarkable. Kidneys are normal, without renal calculi, focal lesion, or hydronephrosis. Bladder is unremarkable. Stomach/Bowel: The appendix is dilated with periappendiceal stranding consistent with acute appendicitis. No appendicolith. Stomach and small and large bowel appear normal. Vascular/Lymphatic: No significant vascular findings are present. No enlarged abdominal or pelvic lymph nodes. Reproductive: Uterus and bilateral adnexa  are unremarkable. Other: None.  Musculoskeletal: Negative. IMPRESSION: Acute appendicitis. Appendix: Location: Inferior to the cecum and posterior to the distal ileum. Diameter: 1.2 cm Appendicolith: Negative. Mucosal hyper-enhancement: Positive. Extraluminal gas: Negative. Periappendiceal collection: Negative. These results were called by telephone at the time of interpretation on 12/06/2018 at 2:19 pm to [facility] , who verbally acknowledged these results. Electronically Signed   By: Drusilla Kannerhomas  Dalessio M.D.   On: 12/06/2018 14:20    Review of Systems  Constitutional: Positive for fever.  Gastrointestinal: Positive for abdominal pain, nausea and vomiting.  All other systems reviewed and are negative.   Blood pressure 134/81, pulse 80, temperature (!) 100.4 F (38 C), temperature source Oral, resp. rate 20, height 5\' 6"  (1.676 m), weight 90.7 kg, last menstrual period 12/05/2018, SpO2 97 %. Physical Exam  Constitutional: She appears well-developed and well-nourished.  HENT:  Head: Normocephalic and atraumatic.  Eyes: Pupils are equal, round, and reactive to light.  Neck: Normal range of motion.  Cardiovascular: Normal rate.  Respiratory: Effort normal.  GI: There is abdominal tenderness in the right lower quadrant. There is no tenderness at McBurney's point.     Assessment/Plan Acute appendicitis  Discussed operative and nonoperative options of treatment.  The pros and cons of each were discussed as well as long-term expectations, complications and the need further treatments and/or procedures.  Recommend laparoscopic appendectomy.  Risk, benefits discussed as well as this as a first-line therapy.  Will test for COVID first.  Explained if she is positive, she will be managed nonoperatively per recent recommendations.The procedure has been discussed with the patient.  Alternative therapies have been discussed with the patient.  Operative risks include bleeding,  Infection,  Organ injury,  Nerve injury,  Blood  vessel injury,  DVT,  Pulmonary embolism,  Death,  And possible reoperation.  Medical management risks include worsening of present situation.  The success of the procedure is 50 -90 % at treating patients symptoms.  The patient understands and agrees to proceed.  Dortha Schwalbehomas A Keisean Skowron, MD 12/06/2018, 3:31 PM

## 2018-12-06 NOTE — ED Notes (Signed)
ED Provider at bedside. 

## 2018-12-06 NOTE — Anesthesia Postprocedure Evaluation (Signed)
Anesthesia Post Note  Patient: Angela Joseph  Procedure(s) Performed: APPENDECTOMY LAPAROSCOPIC (N/A )     Patient location during evaluation: PACU Anesthesia Type: General Level of consciousness: sedated Pain management: pain level controlled Vital Signs Assessment: post-procedure vital signs reviewed and stable Respiratory status: spontaneous breathing and respiratory function stable Cardiovascular status: stable Postop Assessment: no apparent nausea or vomiting Anesthetic complications: no    Last Vitals:  Vitals:   12/06/18 2030 12/06/18 2037  BP:  121/69  Pulse: 90 88  Resp: 14 14  Temp: 36.7 C   SpO2: 95% 96%    Last Pain:  Vitals:   12/06/18 2030  TempSrc:   PainSc: [place]

## 2018-12-06 NOTE — Op Note (Signed)
Appendectomy, Lap, Procedure Note  Indications: The patient presented with a history of right-sided abdominal pain. A CT scan revealed findings consistent with acute appendicitis.  Her examination was consistent with appendicitis as well as her history.  Discussed medical and surgical options.  Discussed the necessity of COVID testing.  She opted for laparoscopic appendectomy.The procedure has been discussed with the patient.  Alternative therapies have been discussed with the patient.  Operative risks include bleeding,  Infection,  Organ injury,  Nerve injury,  Blood vessel injury,  DVT,  Pulmonary embolism,  Death,  And possible reoperation.  Medical management risks include worsening of present situation.  The success of the procedure is 50 -90 % at treating patients symptoms.  The patient understands and agrees to proceed.  Pre-operative Diagnosis: Acute appendicitis without mention of peritonitis  Post-operative Diagnosis: Acute appendicitis without mention of peritonitis  Surgeon: Turner Daniels   MD  Assistants: OR staff  Anesthesia: General endotracheal anesthesia and Local anesthesia 0.5% bupivacaine  ASA Class: 2  Procedure Details  The patient was seen again in the Holding Room. The risks, benefits, complications, treatment options, and expected outcomes were discussed with the patient and/or family. The possibilities of reaction to medication, pulmonary aspiration, perforation of viscus, bleeding, recurrent infection, finding a normal appendix, the need for additional procedures, failure to diagnose a condition, and creating a complication requiring transfusion or operation were discussed. There was concurrence with the proposed plan and informed consent was obtained. The site of surgery was properly noted/marked. The patient was taken to Operating Room, identified as Angela Joseph and the procedure verified as Appendectomy. A Time Out was held and the above information  confirmed.  The patient was placed in the supine position and general anesthesia was induced, along with placement of orogastric tube, Venodyne boots, and a Foley catheter. The abdomen was prepped and draped in a sterile fashion. A one centimeter infraumbilical incision was made and the peritoneal cavity was accessed using the OPEN  technique. The pneumoperitoneum was then established to steady pressure of 12 mmHg. A 12 mm port was placed through the umbilical incision. Additional 5 mm cannulas then placed in the left lower quadrant of the abdomen and half way between the umbilicus and right upper quadrant under direct vision. A careful evaluation of the entire abdomen was carried out. The patient was placed in Trendelenburg and left lateral decubitus position. The small intestines were retracted in the cephalad and left lateral direction away from the pelvis and right lower quadrant. The patient was found to have an enlarged and inflamed appendix that was extending into the pelvis. There was no evidence of perforation.  The appendix was carefully dissected. A window was made in the mesoappendix at the base of the appendix. A harmonic scalpel was used across the mesoappendix. The appendix was divided at its base using an endo-GIA stapler. Minimal appendiceal stump was left in place. There was no evidence of bleeding, leakage, or complication after division of the appendix. Irrigation was also performed and irrigate suctioned from the abdomen as well.  The umbilical port site was closed using 0 vicryl pursestring sutures fashion at the level of the fascia. The trocar site skin wounds were closed using skin staples.  Instrument, sponge, and needle counts were correct at the conclusion of the case.   Findings: The appendix was found to be inflamed. There were signs of necrosis.  There was not perforation. There was not abscess formation.  Estimated Blood Loss:  Minimal         Drains: None          Total IV Fluids: Per anesthesia record         Specimens: Appendix to pathology         Complications:  None; patient tolerated the procedure well.         Disposition: PACU - hemodynamically stable.         Condition: stable

## 2018-12-06 NOTE — ED Provider Notes (Signed)
[facility]    CSN: [id number] Arrival date & time: 12/06/18  1045      History   Chief Complaint Chief Complaint  Patient presents with  . Emesis  . Fever  . Abdominal Pain    HPI Angela Joseph is a 33 y.o. female.   Patient presents with acute RLQ abdominal pain which she rates 8/10.  The pain began 2 days ago.  She began vomiting approximately 24 hours ago and has been unable to keep any fluids down.  She reports having a low-grade fever and chills also.  The history is provided by the patient.    Past Medical History:  Diagnosis Date  . Allergy   . Migraine     There are no active problems to display for this patient.   Past Surgical History:  Procedure Laterality Date  . DENTAL SURGERY    . KNEE SURGERY      OB History    Gravida  0   Para  0   Term  0   Preterm  0   AB  0   Living  0     SAB  0   TAB  0   Ectopic  0   Multiple  0   Live Births  0            Home Medications    Prior to Admission medications   Medication Sig Start Date End Date Taking? Authorizing Provider  BLISOVI 24 FE 1-20 MG-MCG(24) tablet Take 1 tablet by mouth daily. 01/14/18   [provider]  cholecalciferol (VITAMIN D) 1000 units tablet Take 800 Units by mouth daily.     [provider]  hydrOXYzine (ATARAX/VISTARIL) 25 MG tablet Take 1 tablet (25 mg total) by mouth every 8 (eight) hours as needed for itching. 09/22/18   Wallis Bamberg, PA-C  ibuprofen (ADVIL,MOTRIN) 200 MG tablet Take 400 mg by mouth daily as needed for moderate pain.    [provider]  predniSONE (DELTASONE) 20 MG tablet Take 2 tablets daily with breakfast. 09/22/18   Wallis Bamberg, PA-C  traMADol (ULTRAM) 50 MG tablet Take 1 tablet (50 mg total) by mouth every 6 (six) hours as needed. Patient not taking: Reported on 09/22/2018 04/02/18   Garlon Hatchet, PA-C    Family History Family History  Problem Relation Age of Onset  . Hypertension Father   .  Cancer Paternal Grandmother     Social History Social History   Tobacco Use  . Smoking status: Never Smoker  . Smokeless tobacco: Never Used  Substance Use Topics  . Alcohol use: Yes  . Drug use: No     Allergies   Tamiflu [oseltamivir phosphate], Ceclor [cefaclor], Cleocin [clindamycin hcl], Other, Penicillins, and Zinc   Review of Systems Review of Systems  Constitutional: Positive for chills and fever.  HENT: Negative for ear pain and sore throat.   Eyes: Negative for pain and visual disturbance.  Respiratory: Negative for cough and shortness of breath.   Cardiovascular: Negative for chest pain and palpitations.  Gastrointestinal: Positive for abdominal pain, nausea and vomiting.  Genitourinary: Negative for dysuria and hematuria.  Musculoskeletal: Negative for arthralgias and back pain.  Skin: Negative for color change and rash.  Neurological: Negative for seizures and syncope.  All other systems reviewed and are negative.    Physical Exam Triage Vital Signs ED Triage Vitals  Enc Vitals Group     BP      Pulse  Resp      Temp      Temp src      SpO2      Weight      Height      Head Circumference      Peak Flow      Pain Score      Pain Loc      Pain Edu?      Excl. in [place]?    No data found.  Updated Vital Signs BP 137/87 (BP Location: Left Arm)   Pulse (!) 106   Temp 97.9 F (36.6 C) (Temporal)   Resp 16   SpO2 96%   Visual Acuity Right Eye Distance:   Left Eye Distance:   Bilateral Distance:    Right Eye Near:   Left Eye Near:    Bilateral Near:     Physical Exam Vitals signs and nursing note reviewed.  Constitutional:      General: She is not in acute distress.    Appearance: She is well-developed.  HENT:     Head: Normocephalic and atraumatic.  Eyes:     Conjunctiva/sclera: Conjunctivae normal.  Neck:     Musculoskeletal: Neck supple.  Cardiovascular:     Rate and Rhythm: Normal rate and regular rhythm.     Heart sounds:  No murmur.  Pulmonary:     Effort: Pulmonary effort is normal. No respiratory distress.     Breath sounds: Normal breath sounds.  Abdominal:     General: Bowel sounds are normal.     Palpations: Abdomen is soft.     Tenderness: There is abdominal tenderness. There is rebound. There is no guarding.     Comments: Acute tenderness to palpation of RLQ  Skin:    General: Skin is warm and dry.  Neurological:     Mental Status: She is alert.  Psychiatric:        Mood and Affect: Mood normal.        Behavior: Behavior normal.      UC Treatments / Results  Labs (all labs ordered are listed, but only abnormal results are displayed) Labs Reviewed  NOVEL CORONAVIRUS, NAA (HOSP ORDER, SEND-OUT TO REF LAB; TAT 18-24 HRS)    EKG   Radiology No results found.  Procedures Procedures (including critical care time)  Medications Ordered in UC Medications - No data to display  Initial Impression / Assessment and Plan / UC Course  I have reviewed the triage vital signs and the nursing notes.  Pertinent labs & imaging results that were available during my care of the patient were reviewed by me and considered in my medical decision making (see chart for details).    RLQ abdominal pain, Emesis.    Sending patient to the emergency department for evaluation due to her acute abdominal tenderness to palpation in her RLQ.     Final Clinical Impressions(s) / UC Diagnoses   Final diagnoses:  RLQ abdominal pain     Discharge Instructions     Go to the Emergency Department for evaluation of your right lower abdominal pain.        ED Prescriptions    None     PDMP not reviewed this encounter.   Sharion Balloon, NP 12/06/18 1122

## 2018-12-06 NOTE — Anesthesia Procedure Notes (Addendum)
Procedure Name: Intubation Date/Time: 12/06/2018 6:36 PM Performed by: Claris Che, CRNA Pre-anesthesia Checklist: Patient identified, Emergency Drugs available, Suction available, Patient being monitored and Timeout performed Patient Re-evaluated:Patient Re-evaluated prior to induction Oxygen Delivery Method: Circle system utilized Preoxygenation: Pre-oxygenation with 100% oxygen Induction Type: IV induction, Rapid sequence and Cricoid Pressure applied Laryngoscope Size: Mac and 3 Grade View: Grade I Tube type: Oral Tube size: 7.0 mm Number of attempts: 1 Airway Equipment and Method: Stylet Placement Confirmation: ETT inserted through vocal cords under direct vision,  positive ETCO2 and breath sounds checked- equal and bilateral Secured at: 23 cm Tube secured with: Tape Dental Injury: Teeth and Oropharynx as per pre-operative assessment

## 2018-12-06 NOTE — ED Notes (Signed)
Patient is being discharged from the [facility] and sent to the Emergency Department. Per Claiborne Billings, [place], patient is stable but in need of higher level of care due to acute right lower quadrant abdominal pain in need of imaging. Patient is aware and verbalizes understanding of plan of care.  Vitals:   12/06/18 1104  BP: 137/87  Pulse: (!) 106  Resp: 16  Temp: 97.9 F (36.6 C)  SpO2: 96%

## 2018-12-06 NOTE — Transfer of Care (Signed)
Immediate Anesthesia Transfer of Care Note  Patient: Angela Joseph  Procedure(s) Performed: APPENDECTOMY LAPAROSCOPIC (N/A )  Patient Location: PACU  Anesthesia Type:General  Level of Consciousness: oriented, sedated, drowsy, patient cooperative and responds to stimulation  Airway & Oxygen Therapy: Patient Spontanous Breathing and Patient connected to nasal cannula oxygen  Post-op Assessment: Report given to RN, Post -op Vital signs reviewed and stable and Patient moving all extremities X 4  Post vital signs: Reviewed and stable  Last Vitals:  Vitals Value Taken Time  BP 131/88 12/06/18 1938  Temp    Pulse 89 12/06/18 1939  Resp 19 12/06/18 1939  SpO2 95 % 12/06/18 1939  Vitals shown include unvalidated device data.  Last Pain:  Vitals:   12/06/18 1640  TempSrc:   PainSc: 5       Patients Stated Pain Goal: 3 (36/64/40 3474)  Complications: No apparent anesthesia complications

## 2018-12-06 NOTE — Progress Notes (Signed)
Attempt to call report - nurse unavailable - will transfer pt to room in 10 mins & give bedside report. Pt;s mother updated as noted per pt request. Per pt's mother the earliest she can be at the hospital to pick up pt is 12 noon on 12/07/18. Plan is to discharge pt in the morning

## 2018-12-07 ENCOUNTER — Encounter (HOSPITAL_COMMUNITY): Payer: Self-pay | Admitting: Surgery

## 2018-12-07 MED ORDER — ACETAMINOPHEN 325 MG PO TABS
650.0000 mg | ORAL_TABLET | Freq: Four times a day (QID) | ORAL | Status: DC
  Administered 2018-12-07: 650 mg via ORAL
  Filled 2018-12-07: qty 2

## 2018-12-07 MED ORDER — ONDANSETRON 4 MG PO TBDP: 4.0000 mg | ORAL_TABLET | Freq: Four times a day (QID) | ORAL | 0 refills | Status: DC | PRN

## 2018-12-07 MED ORDER — ACETAMINOPHEN 325 MG PO TABS: 650.0000 mg | ORAL_TABLET | Freq: Four times a day (QID) | ORAL | Status: DC

## 2018-12-07 MED ORDER — CIPROFLOXACIN HCL 500 MG PO TABS: 500.0000 mg | ORAL_TABLET | Freq: Two times a day (BID) | ORAL | 0 refills | Status: AC

## 2018-12-07 MED ORDER — METRONIDAZOLE 500 MG PO TABS: 500.0000 mg | ORAL_TABLET | Freq: Two times a day (BID) | ORAL | 0 refills | Status: DC

## 2018-12-07 MED ORDER — OXYCODONE HCL 5 MG PO TABS: 5.0000 mg | ORAL_TABLET | ORAL | 0 refills | Status: DC | PRN

## 2018-12-07 NOTE — Discharge Instructions (Signed)

## 2018-12-07 NOTE — Discharge Summary (Addendum)
[facility]uma Discharge Summary   Patient ID: Angela Joseph MRN: [id number] DOB/AGE: 35-Jun-1987 35 y.o.  Admit date: 12/06/2018 Discharge date: 12/07/2018  Admitting Diagnosis: Acute appendicitis   Discharge Diagnosis Patient Active Problem List   Diagnosis Date Noted  . Acute appendicitis 12/06/2018    Consultants none  Imaging: Ct Abdomen Pelvis W Contrast  Result Date: 12/06/2018 CLINICAL DATA:  Right lower quadrant pain with low grade fever and chills for 2 days. Elevated white blood cell count. EXAM: CT ABDOMEN AND PELVIS WITH CONTRAST TECHNIQUE: Multidetector CT imaging of the abdomen and pelvis was performed using the standard protocol following bolus administration of intravenous contrast. CONTRAST:  100 mL OMNIPAQUE IOHEXOL 300 MG/ML  SOLN COMPARISON:  CT abdomen and pelvis 04/02/2018. FINDINGS: Lower chest: Mild dependent atelectasis. No pleural or pericardial effusion. Hepatobiliary: No focal liver abnormality is seen. No gallstones, gallbladder wall thickening, or biliary dilatation. Pancreas: Unremarkable. No pancreatic ductal dilatation or surrounding inflammatory changes. Spleen: Normal in size without focal abnormality. Adrenals/Urinary Tract: Adrenal glands are unremarkable. Kidneys are normal, without renal calculi, focal lesion, or hydronephrosis. Bladder is unremarkable. Stomach/Bowel: The appendix is dilated with periappendiceal stranding consistent with acute appendicitis. No appendicolith. Stomach and small and large bowel appear normal. Vascular/Lymphatic: No significant vascular findings are present. No enlarged abdominal or pelvic lymph nodes. Reproductive: Uterus and bilateral adnexa are unremarkable. Other: None. Musculoskeletal: Negative. IMPRESSION: Acute appendicitis. Appendix: Location: Inferior to the cecum and posterior to the distal ileum. Diameter: 1.2 cm Appendicolith: Negative. Mucosal hyper-enhancement: Positive. Extraluminal gas:  Negative. Periappendiceal collection: Negative. These results were called by telephone at the time of interpretation on 12/06/2018 at 2:19 pm to provider [facility] , who verbally acknowledged these results. Electronically Signed   By: Drusilla Kanner M.D.   On: 12/06/2018 14:20    Procedures Dr. Luisa Hart (12/06/18) - Laparoscopic Appendectomy  HPI: Patient presents emergency room with a 2-day history of lower abdominal pain.  Pain is crampy in nature.  The pain started 2 days ago.  She has had a low-grade fever as well.  She has had associated nausea and vomiting.  Pain is made worse when the area is pushed on.  She complains most of her pains in her right lower abdomen without radiation.  Denies dysuria or blood in her urine.  CT scan shows acute appendicitis.  Hospital Course:  Patient was admitted and underwent procedure listed above.  Tolerated procedure well and was transferred to the floor.  Diet was advanced as tolerated.  On POD#1, the patient was voiding well, tolerating diet, ambulating well, pain well controlled, vital signs stable, incisions c/d/i and felt stable for discharge home. Patient will be discharged on 7 days of antibiotics. Patient will follow up as outlined below and knows to call with questions or concerns.     Patient was discharged in good condition.  The [place] Substance controlled database was reviewed prior to prescribing narcotic pain medication to this patient.  Physical Exam: General:  Alert, NAD, pleasant, cooperative Cardio: RRR, S1 & S2 normal, no murmur, rubs, gallops Resp: Effort normal, lungs CTA bilaterally, no wheezes, rales, rhonchi Abd:  Soft, ND, normal bowel sounds, incisions with glue intact appear well, TTP of RLQ with guarding, no peritonitis Extremities: no TTP or swelling to calves BL Skin: warm and dry, no rashes noted  Allergies as of 12/07/2018      Reactions   Tamiflu [oseltamivir Phosphate]    Bee Venom Swelling   Yellow  jacket  Ceclor [cefaclor] Rash, Other (See Comments)   High fever and rash    Cleocin [clindamycin Hcl] Rash, Other (See Comments)   Fever and rash in childhood   Other Rash, Other (See Comments)   All cilins and mycins causes rash and high fever at childhood    Penicillins Rash, Other (See Comments)   Fever and rash in childhood Did it involve swelling of the face/tongue/throat, SOB, or low BP? Y Did it involve sudden or severe rash/hives, skin peeling, or any reaction on the inside of your mouth or nose? UNK Did you need to seek medical attention at a hospital or doctor's office? UNK When did it last happen?more than 10 years If all above answers are "NO", may proceed with cephalosporin use.   Zinc Rash   Zinc oxide      Medication List    STOP taking these medications   hydrOXYzine 25 MG tablet Commonly known as: ATARAX/VISTARIL   predniSONE 20 MG tablet Commonly known as: DELTASONE   traMADol 50 MG tablet Commonly known as: ULTRAM     TAKE these medications   acetaminophen 325 MG tablet Commonly known as: TYLENOL Take 2 tablets (650 mg total) by mouth every 6 (six) hours.   Blisovi 24 Fe 1-20 MG-MCG(24) tablet Generic drug: Norethindrone Acetate-Ethinyl Estrad-FE Take 1 tablet by mouth daily.   cholecalciferol 1000 units tablet Commonly known as: VITAMIN D Take 800 Units by mouth daily.   ciprofloxacin 500 MG tablet Commonly known as: Cipro Take 1 tablet (500 mg total) by mouth 2 (two) times daily for 7 days.   ibuprofen 200 MG tablet Commonly known as: ADVIL Take 400 mg by mouth daily as needed for moderate pain.   metroNIDAZOLE 500 MG tablet Commonly known as: FLAGYL Take 1 tablet (500 mg total) by mouth 2 (two) times daily.   oxyCODONE 5 MG immediate release tablet Commonly known as: Oxy IR/ROXICODONE Take 1 tablet (5 mg total) by mouth every 4 (four) hours as needed for moderate pain.       Follow-up [facility], [place]. Schedule an appointment as soon as possible for a visit in 2 week(s).   Specialty: General Surgery Why: for follow up Contact information: [address] [place] [phone]           Signed: [facility] 12/07/2018, 8:32 AM Pager: [phone] Consults: [phone] Mon-Fri 7:00 am-4:30 pm Sat-Sun 7:00 am-11:30 am

## 2018-12-08 LAB — SURGICAL PATHOLOGY

## 2018-12-24 DIAGNOSIS — R109 Unspecified abdominal pain: Secondary | ICD-10-CM | POA: Diagnosis not present

## 2018-12-25 ENCOUNTER — Other Ambulatory Visit: Payer: Self-pay

## 2018-12-25 ENCOUNTER — Other Ambulatory Visit: Payer: Self-pay | Admitting: Obstetrics & Gynecology

## 2018-12-25 ENCOUNTER — Other Ambulatory Visit (HOSPITAL_COMMUNITY): Payer: Self-pay | Admitting: Student

## 2018-12-25 ENCOUNTER — Other Ambulatory Visit: Payer: Self-pay | Admitting: Student

## 2018-12-25 ENCOUNTER — Emergency Department (HOSPITAL_COMMUNITY)
Admission: EM | Admit: 2018-12-25 | Discharge: 2018-12-26 | Disposition: A | Discharge status: Home / Self Care | Payer: BC Managed Care – PPO | Attending: Emergency Medicine | Admitting: Emergency Medicine

## 2018-12-25 ENCOUNTER — Emergency Department (HOSPITAL_COMMUNITY): Payer: BC Managed Care – PPO

## 2018-12-25 ENCOUNTER — Ambulatory Visit
Admission: RE | Admit: 2018-12-25 | Discharge: 2018-12-25 | Disposition: A | Discharge status: Home / Self Care | Payer: BC Managed Care – PPO | Source: Ambulatory Visit | Attending: Student | Admitting: Student

## 2018-12-25 ENCOUNTER — Encounter (HOSPITAL_COMMUNITY): Payer: Self-pay | Admitting: Emergency Medicine

## 2018-12-25 DIAGNOSIS — Z9889 Other specified postprocedural states: Secondary | ICD-10-CM | POA: Diagnosis not present

## 2018-12-25 DIAGNOSIS — N83511 Torsion of right ovary and ovarian pedicle: Secondary | ICD-10-CM

## 2018-12-25 DIAGNOSIS — R195 Other fecal abnormalities: Secondary | ICD-10-CM | POA: Diagnosis not present

## 2018-12-25 DIAGNOSIS — D259 Leiomyoma of uterus, unspecified: Secondary | ICD-10-CM | POA: Diagnosis not present

## 2018-12-25 DIAGNOSIS — Z20828 Contact with and (suspected) exposure to other viral communicable diseases: Secondary | ICD-10-CM | POA: Diagnosis not present

## 2018-12-25 DIAGNOSIS — R109 Unspecified abdominal pain: Secondary | ICD-10-CM

## 2018-12-25 DIAGNOSIS — G8918 Other acute postprocedural pain: Secondary | ICD-10-CM | POA: Diagnosis not present

## 2018-12-25 DIAGNOSIS — R630 Anorexia: Secondary | ICD-10-CM | POA: Diagnosis not present

## 2018-12-25 DIAGNOSIS — N838 Other noninflammatory disorders of ovary, fallopian tube and broad ligament: Secondary | ICD-10-CM | POA: Diagnosis not present

## 2018-12-25 DIAGNOSIS — R935 Abnormal findings on diagnostic imaging of other abdominal regions, including retroperitoneum: Secondary | ICD-10-CM | POA: Diagnosis not present

## 2018-12-25 DIAGNOSIS — N3289 Other specified disorders of bladder: Secondary | ICD-10-CM | POA: Diagnosis not present

## 2018-12-25 DIAGNOSIS — R1031 Right lower quadrant pain: Secondary | ICD-10-CM | POA: Diagnosis not present

## 2018-12-25 DIAGNOSIS — K388 Other specified diseases of appendix: Secondary | ICD-10-CM | POA: Diagnosis not present

## 2018-12-25 DIAGNOSIS — N83291 Other ovarian cyst, right side: Secondary | ICD-10-CM | POA: Diagnosis not present

## 2018-12-25 DIAGNOSIS — Z03818 Encounter for observation for suspected exposure to other biological agents ruled out: Secondary | ICD-10-CM | POA: Diagnosis not present

## 2018-12-25 LAB — URINALYSIS, ROUTINE W REFLEX MICROSCOPIC
Bilirubin Urine: NEGATIVE
Glucose, UA: NEGATIVE mg/dL
Ketones, ur: NEGATIVE mg/dL
Leukocytes,Ua: NEGATIVE
Nitrite: NEGATIVE
Protein, ur: NEGATIVE mg/dL
Specific Gravity, Urine: 1.028 (ref 1.005–1.030)
pH: 7 (ref 5.0–8.0)

## 2018-12-25 LAB — CBC WITH DIFFERENTIAL/PLATELET
Abs Immature Granulocytes: 0.01 10*3/uL (ref 0.00–0.07)
Basophils Absolute: 0 10*3/uL (ref 0.0–0.1)
Basophils Relative: 1 %
Eosinophils Absolute: 0.5 10*3/uL (ref 0.0–0.5)
Eosinophils Relative: 6 %
HCT: 43 % (ref 36.0–46.0)
Hemoglobin: 13.5 g/dL (ref 12.0–15.0)
Immature Granulocytes: 0 %
Lymphocytes Relative: 29 %
Lymphs Abs: 2.4 10*3/uL (ref 0.7–4.0)
MCH: 27.4 pg (ref 26.0–34.0)
MCHC: 31.4 g/dL (ref 30.0–36.0)
MCV: 87.2 fL (ref 80.0–100.0)
Monocytes Absolute: 0.6 10*3/uL (ref 0.1–1.0)
Monocytes Relative: 8 %
Neutro Abs: 4.6 10*3/uL (ref 1.7–7.7)
Neutrophils Relative %: 56 %
Platelets: 541 10*3/uL — ABNORMAL HIGH (ref 150–400)
RBC: 4.93 MIL/uL (ref 3.87–5.11)
RDW: 13.2 % (ref 11.5–15.5)
WBC: 8.2 10*3/uL (ref 4.0–10.5)
nRBC: 0 % (ref 0.0–0.2)

## 2018-12-25 LAB — COMPREHENSIVE METABOLIC PANEL
ALT: 29 U/L (ref 0–44)
AST: 21 U/L (ref 15–41)
Albumin: 3.9 g/dL (ref 3.5–5.0)
Alkaline Phosphatase: 101 U/L (ref 38–126)
Anion gap: 11 (ref 5–15)
BUN: 6 mg/dL (ref 6–20)
CO2: 26 mmol/L (ref 22–32)
Calcium: 9.4 mg/dL (ref 8.9–10.3)
Chloride: 103 mmol/L (ref 98–111)
Creatinine, Ser: 0.88 mg/dL (ref 0.44–1.00)
GFR calc Af Amer: >60 mL/min (ref >60)
GFR calc non Af Amer: >60 mL/min (ref >60)
Glucose, Bld: 104 mg/dL — ABNORMAL HIGH (ref 70–99)
Potassium: 3.8 mmol/L (ref 3.5–5.1)
Sodium: 140 mmol/L (ref 135–145)
Total Bilirubin: 0.3 mg/dL (ref 0.3–1.2)
Total Protein: 8.1 g/dL (ref 6.5–8.1)

## 2018-12-25 LAB — I-STAT BETA HCG BLOOD, ED (MC, WL, AP ONLY): I-stat hCG, quantitative: <5 m[IU]/mL (ref <5)

## 2018-12-25 MED ORDER — SODIUM CHLORIDE 0.9 % IV BOLUS
1000.0000 mL | Freq: Once | INTRAVENOUS | Status: AC
  Administered 2018-12-25: 18:00:00 1000 mL via INTRAVENOUS

## 2018-12-25 MED ORDER — PROMETHAZINE HCL 25 MG/ML IJ SOLN
25.0000 mg | Freq: Once | INTRAMUSCULAR | Status: AC
  Administered 2018-12-25: 25 mg via INTRAVENOUS
  Filled 2018-12-25: qty 1

## 2018-12-25 MED ORDER — KETOROLAC TROMETHAMINE 30 MG/ML IJ SOLN
30.0000 mg | Freq: Once | INTRAMUSCULAR | Status: AC
  Administered 2018-12-25: 30 mg via INTRAVENOUS
  Filled 2018-12-25: qty 1

## 2018-12-25 MED ORDER — IOPAMIDOL (ISOVUE-300) INJECTION 61%
100.0000 mL | Freq: Once | INTRAVENOUS | Status: AC | PRN
  Administered 2018-12-25: 100 mL via INTRAVENOUS

## 2018-12-25 NOTE — ED Provider Notes (Signed)
33 year old female received at signout from Dr. Tamera Punt pending OB/GYN consult.  Per her HPI:  "Angela Joseph is a 33 y.o. female.   Patient is a 33 year old female with no significant past medical history who presents with abdominal pain.  She was admitted for appendicitis on October 11 and had an appendectomy at that time.  She was discharged on October 12.  She states a couple days later she had to go back to the emergency room near her mom's house and was treated for an ileus/small bowel obstruction.  She had NG tube placed and was admitted for 2 days.  She has been doing well since being home.  She had started cautiously running again.  She had had an increase in her appetite.  However she states about 3 days ago she started having some soreness down to her right lower abdomen again.  She initially thought it was from the running but it is progressively gotten worse.  It is in the same area where she had her appendectomy.  She is also had a decreased appetite.  She has had no vomiting.  No fevers.  No urinary symptoms.  No vaginal complaints.  She was seen at [facility] surgery yesterday and a CT scan was ordered for today.  She had the CT scan and it showed some abnormal findings around her ovary concerning for possible infection versus torsion.  There is also some possible inflammation around her bladder." Physical Exam  BP (!) 146/72 (BP Location: Right Arm)   Pulse 77   Temp 98.3 F (36.8 C) (Oral)   Resp 18   Ht 5\' 9"  (1.753 m)   Wt 93.8 kg   LMP 12/05/2018 Comment: Negative HCG in ED today  SpO2 99%   BMI 30.54 kg/m   Physical Exam Vitals signs and nursing note reviewed.  Constitutional:      General: She is not in acute distress.    Comments: NAD  HENT:     Head: Normocephalic.  Eyes:     Conjunctiva/sclera: Conjunctivae normal.  Neck:     Musculoskeletal: Neck supple.  Cardiovascular:     Rate and Rhythm: Normal rate and regular rhythm.     Heart sounds: No  murmur. No friction rub. No gallop.   Pulmonary:     Effort: Pulmonary effort is normal. No respiratory distress.  Abdominal:     General: There is no distension.     Palpations: Abdomen is soft.  Skin:    General: Skin is warm.     Findings: No rash.  Neurological:     Mental Status: She is alert.  Psychiatric:        Behavior: Behavior normal.     ED Course/Procedures     Procedures  MDM   33 year old female received at signout from Dr. Tamera Punt pending OB/GYN consult.  Please see her note for further work-up and medical decision making.  In brief, the patient had a laparoscopic appendectomy on October 11.  She is here with 3 days of worsening right lower quadrant pain and anorexia.  She had a CT scan performed at [facility] earlier today that showed some mild soft tissue thickening at the level of the right ovary, which prompted her to come to the ER.   - Spoke with Dr. Phineas Real with OB/GYN.  Pelvic exam was performed, and he does not feel that her pain is pelvic in nature based on her pelvic exam.  - Spoke with Dr. Marcello Moores following  evaluation by Dr. Audie Box.  She does not feel that admission nor surgical intervention is warranted at this time.  Recommends discharge to home with outpatient follow-up in the clinic.  -23:00-patient evaluated by me.  The patient and I had a long shared decision making conversation regarding updated consult information in addition to her labs and imaging.  She expresses concern that her pain may also be due to her gallbladder or kidney stones.  We have reviewed her labs and imaging extensively.  She would like to have a dose of Toradol and Phenergan.  I will reassess her pain after this medication and will likely plan for discharge to home with outpatient general surgery follow-up.  On reevaluation, she reports that her pain is about the same, but she would like to be discharged home.  She would like to avoid narcotics, but is requesting  prescription strength ibuprofen and Phenergan, which is reasonable and has been given prescriptions for both.  She has been advised to follow-up with [facility] calling their office when they reopen on Monday morning.  She is also been given strict ER return precautions, including worsening pain, or if she develops a fever and chills, or other new or worsening symptoms.  She is agreeable with this plan at this time.  Her CT scan earlier today did show some bladder wall thickening.  However, urine does not appear infectious at this time.  Urine culture has been sent.  She is not having any urinary complaints so will hold on antibiotics until urine culture has resulted.  All questions answered.  She is hemodynamically stable and in no acute distress.  Safe for discharge to home with outpatient general surgery follow-up.    Frederik Pear A, PA-C 12/26/18 0041    Rolan Bucco, MD 01/01/19 1037

## 2018-12-25 NOTE — Consult Note (Signed)
  Angela Joseph 1985-04-12 [id number]        33 y.o.  G0P0000 with a history of laparoscopic appendectomy 06 December 2018.  Her postoperative course was complicated by ileus which led to readmission the week after surgery.  Since discharge she progressively has felt better slowly resuming normal activities until several days ago with the onset of right lower quadrant pain that she describes as identical to the pain she was having with her acute appendicitis.  She denies fever or chills.  Is tolerating oral diet and having bowel movements.  Current evaluation shows a normal white count of 8 hemoglobin 13.5 negative beta-hCG with normal temperature and a CT scan that shows mild soft tissue stranding immediately adjacent to the right ovary and soft tissue thickening in the periappendiceal region consistent with her recent surgery.  Follow-up ultrasound showed a normal-appearing right ovary with a thick-walled cystic lesion right adnexa separate from the ovary measuring 3.2 x 1.2 x 2.5 cm.  Left ovary and adnexa are normal.  Doppler flow was demonstrated to both ovaries.  She currently is on continuous oral contraceptives for menstrual suppression.  No history of STDs or pelvic infections in the past.  Past medical history,surgical history, problem list, medications, allergies, family history and social history were all reviewed and documented in the EPIC chart.  Directed ROS with pertinent positives and negatives documented in the history of present illness/assessment and plan.  Exam: Emergency room nurse assistant Vitals:   12/25/18 1937 12/25/18 2000 12/25/18 2030 12/25/18 2214  BP:  134/80 126/85 (!) 130/92  Pulse:  71 66 74  Resp:  18 18 18   Temp:      TempSrc:      SpO2:  99% 98% 99%  Weight: 93.8 kg     Height: 5\' 9"  (1.753 m)      General appearance:  Normal in no acute distress HEENT normal Lungs clear Cardiac regular rate no rubs murmurs or gallops Abdomen with active bowel sounds  throughout, soft with tenderness in the right lower quadrant McBurney's point.  No rebound or guarding Pelvic bimanual shows cervix palpates normal with no cervical motion tenderness.  Uterus grossly normal size midline mobile.  Adnexa without masses or significant tenderness.  Assessment/Plan:  33 y.o. G0P0000 with history of recent appendectomy and recurrent right lower quadrant pain.  Findings on CT scan I believe are postsurgical changes and do not reflect acute GYN pathology.  She is on continuous oral contraceptives for menstrual suppression with a negative hCG eliminating ectopic and her ultrasound shows no ovarian pathology on the right with good Doppler flow eliminating torsion.  A primary GYN infection unlikely given normal white count, no temperature, benign GYN pelvic exam and no history of pelvic infections or STDs in the past.  I believe her pain is related to her recent surgery and not of GYN etiology   Anastasio Auerbach MD, 10:52 PM 12/25/2018

## 2018-12-25 NOTE — Progress Notes (Signed)
Pt is 3 wks s/p uncomplicated lap appy.  She presents to ed due to 3 days recurrent rlq pain.  No fevers, nausea or changes in bowel habits.   Exam Vitals stable Gen: nad Abd: soft, mild rlq tenderness, no peritoneal signs  CT reviewed: normal post operative findings noted around cecum.  Fluid around R ovary may be post operative or related to ovarian pathology.  Unlikely to be abscess with wbc normal and no fevers< BR> Assessment:  I see no signs that pt's pain is related to a post op complication.  Plan: consider gyn eval F/u in the office as needed

## 2018-12-25 NOTE — ED Notes (Signed)
Patient ambulatory to restroom with no assistance.  

## 2018-12-25 NOTE — ED Triage Notes (Signed)
Pt having abd pains since Tuesday night. Reports that she had CT scan and told to go for ultrasound for possible ovarian torsion. Had appendix removed this month at [facility].

## 2018-12-25 NOTE — ED Provider Notes (Signed)
Patient is a 33 year old female Angela Joseph DEPT Provider Note   CSN: [id number] Arrival date & time: 12/25/18  1539     History   Chief Complaint Chief Complaint  Patient presents with   abnormal CT scan    HPI Angela Joseph is a 33 y.o. female.     Patient is a 33 year old female with no significant past medical history who presents with abdominal pain.  She was admitted for appendicitis on October 11 and had an appendectomy at that time.  She was discharged on October 12.  She states a couple days later she had to go back to the emergency room near her mom's house and was treated for an ileus/small bowel obstruction.  She had NG tube placed and was admitted for 2 days.  She has been doing well since being home.  She had started cautiously running again.  She had had an increase in her appetite.  However she states about 3 days ago she started having some soreness down to her right lower abdomen again.  She initially thought it was from the running but it is progressively gotten worse.  It is in the same area where she had her appendectomy.  She is also had a decreased appetite.  She has had no vomiting.  No fevers.  No urinary symptoms.  No vaginal complaints.  She was seen at [facility] surgery yesterday and a CT scan was ordered for today.  She had the CT scan and it showed some abnormal findings around her ovary concerning for possible infection versus torsion.  There is also some possible inflammation around her bladder.     Past Medical History:  Diagnosis Date   Allergy    Migraine     Patient Active Problem List   Diagnosis Date Noted   Acute appendicitis 12/06/2018    Past Surgical History:  Procedure Laterality Date   DENTAL SURGERY     KNEE SURGERY     LAPAROSCOPIC APPENDECTOMY N/A 12/06/2018   Procedure: APPENDECTOMY LAPAROSCOPIC;  Surgeon: Erroll Luna, MD;  Location: [place];  Service: General;  Laterality: N/A;       OB History    Gravida  0   Para  0   Term  0   Preterm  0   AB  0   Living  0     SAB  0   TAB  0   Ectopic  0   Multiple  0   Live Births  0            Home Medications    Prior to Admission medications   Medication Sig Start Date End Date Taking? Authorizing Provider  BLISOVI 24 FE 1-20 MG-MCG(24) tablet TAKE 1 TABLET BY MOUTH DAILY. CONTINUOUS USE FOR SEVERE DYSMENORRHEA Patient taking differently: Take 1 tablet by mouth daily.  12/25/18  Yes Princess Bruins, MD  ibuprofen (ADVIL,MOTRIN) 200 MG tablet Take 400 mg by mouth daily as needed for moderate pain.   Yes [provider]  promethazine (PHENERGAN) 12.5 MG tablet Take 12.5 mg by mouth every 6 (six) hours as needed for nausea/vomiting. 12/10/18  Yes [provider]  acetaminophen (TYLENOL) 325 MG tablet Take 2 tablets (650 mg total) by mouth every 6 (six) hours. Patient not taking: Reported on 12/25/2018 12/07/18   Kalman Drape, PA  metroNIDAZOLE (FLAGYL) 500 MG tablet Take 1 tablet (500 mg total) by mouth 2 (two) times daily. Patient not taking: Reported on  12/25/2018 12/07/18   Focht, Joyce Copa, PA  ondansetron (ZOFRAN-ODT) 4 MG disintegrating tablet Take 1 tablet (4 mg total) by mouth every 6 (six) hours as needed for nausea. Patient not taking: Reported on 12/25/2018 12/07/18   Mattie Marlin L, PA  oxyCODONE (OXY IR/ROXICODONE) 5 MG immediate release tablet Take 1 tablet (5 mg total) by mouth every 4 (four) hours as needed for moderate pain. Patient not taking: Reported on 12/25/2018 12/07/18   Jerre Simon, PA    Family History Family History  Problem Relation Age of Onset   Hypertension Father    Cancer Paternal Grandmother     Social History Social History   Tobacco Use   Smoking status: Never Smoker   Smokeless tobacco: Never Used  Substance Use Topics   Alcohol use: Yes   Drug use: No     Allergies   Tamiflu [oseltamivir phosphate], Bee  venom, Ceclor [cefaclor], Cleocin [clindamycin hcl], Other, Penicillins, and Zinc   Review of Systems Review of Systems  Constitutional: Positive for appetite change. Negative for chills, diaphoresis, fatigue and fever.  HENT: Negative for congestion, rhinorrhea and sneezing.   Eyes: Negative.   Respiratory: Negative for cough, chest tightness and shortness of breath.   Cardiovascular: Negative for chest pain and leg swelling.  Gastrointestinal: Positive for abdominal pain. Negative for blood in stool, diarrhea, nausea and vomiting.  Genitourinary: Negative for difficulty urinating, flank pain, frequency and hematuria.  Musculoskeletal: Negative for arthralgias and back pain.  Skin: Negative for rash.  Neurological: Negative for dizziness, speech difficulty, weakness, numbness and headaches.     Physical Exam Updated Vital Signs BP 126/85 (BP Location: Right Arm)    Pulse 66    Temp 98.3 F (36.8 C) (Oral)    Resp 18    Ht 5\' 9"  (1.753 m)    Wt 93.8 kg    LMP 12/05/2018 Comment: Negative HCG in ED today   SpO2 98%    BMI 30.54 kg/m   Physical Exam Constitutional:      Appearance: She is well-developed.  HENT:     Head: Normocephalic and atraumatic.  Eyes:     Pupils: Pupils are equal, round, and reactive to light.  Neck:     Musculoskeletal: Normal range of motion and neck supple.  Cardiovascular:     Rate and Rhythm: Normal rate and regular rhythm.     Heart sounds: Normal heart sounds.  Pulmonary:     Effort: Pulmonary effort is normal. No respiratory distress.     Breath sounds: Normal breath sounds. No wheezing or rales.  Chest:     Chest wall: No tenderness.  Abdominal:     General: Bowel sounds are normal.     Palpations: Abdomen is soft.     Tenderness: There is abdominal tenderness (Positive tenderness to the right lower quadrant at McBurney's point.). There is no guarding or rebound.  Musculoskeletal: Normal range of motion.  Lymphadenopathy:     Cervical: No  cervical adenopathy.  Skin:    General: Skin is warm and dry.     Findings: No rash.  Neurological:     Mental Status: She is alert and oriented to person, place, and time.      ED Treatments / Results  Labs (all labs ordered are listed, but only abnormal results are displayed) Labs Reviewed  CBC WITH DIFFERENTIAL/PLATELET - Abnormal; Notable for the following components:      Result Value   Platelets 541 (*)  All other components within normal limits  COMPREHENSIVE METABOLIC PANEL - Abnormal; Notable for the following components:   Glucose, Bld 104 (*)    All other components within normal limits  SARS CORONAVIRUS 2 (TAT 6-24 HRS)  URINALYSIS, ROUTINE W REFLEX MICROSCOPIC  I-STAT BETA HCG BLOOD, ED ([facility], [facility], [facility] ONLY)    EKG None  Radiology Ct Abdomen Pelvis W Contrast  Result Date: 12/25/2018 CLINICAL DATA:  Abdominal pain following recent appendectomy EXAM: CT ABDOMEN AND PELVIS WITH CONTRAST TECHNIQUE: Multidetector CT imaging of the abdomen and pelvis was performed using the standard protocol following bolus administration of intravenous contrast. Oral contrast was administered. CONTRAST:  100mL ISOVUE-300 IOPAMIDOL (ISOVUE-300) INJECTION 61% COMPARISON:  December 06, 2018 FINDINGS: Lower chest: Lung bases are clear. Hepatobiliary: No focal liver lesions are evident. Gallbladder wall is not appreciably thickened. There is no biliary duct dilatation. Pancreas: There is no pancreatic mass or inflammatory focus. Spleen: No splenic lesions are evident. Adrenals/Urinary Tract: Adrenals bilaterally appear normal. Kidneys bilaterally show no evident mass or hydronephrosis on either side. There is no renal or ureteral calculus on either side. Urinary bladder is midline with wall thickness slightly increased. Stomach/Bowel: There is moderate stool in the colon. There is no appreciable bowel wall or mesenteric thickening. There is no evident bowel obstruction. The terminal ileum appears  unremarkable. There is no free air or portal venous air. Vascular/Lymphatic: There is no abdominal aortic aneurysm. No vascular lesions are evident on this study. No adenopathy is appreciable in the abdomen or pelvis. Reproductive: The uterus is midline. There is mild soft tissue stranding immediately adjacent to the right ovary. There is no appreciable right ovarian mass. No free pelvic fluid evident on this study. Other: The appendix is absent. There is slight soft tissue thickening in the periappendiceal region consistent with recent surgery. No fluid collection is seen in this area. No evidence of focal air in the mesentery in this area. There is elsewhere no evident abscess or ascites in the abdomen or pelvis. There is soft tissue thickening in the umbilical region, likely due to recent surgery. Musculoskeletal: No blastic or lytic bone lesions are evident. No intramuscular lesion evident. IMPRESSION: 1. The right ovary is less well-defined currently than on the previous study with mild soft tissue thickening immediately at the level of the right ovary. No associated mass. This finding potentially could be indicative of infectious etiology involving the right adnexa. Right ovarian torsion is also a possibility given this appearance. Given this circumstance, advise pelvic ultrasound including ovarian Doppler assessment for further evaluation. 2. Mild soft tissue thickening in the periappendiceal region. No fluid or evidence of abscess in this area. No abscess elsewhere in the abdomen or pelvis. 3.  No bowel obstruction. 4. Slight thickening of the urinary bladder wall. There may be a degree of cystitis. Correlation with urinalysis advised in this regard. Note that there is no hydronephrosis on either side. No renal or ureteral calculi on either side. These results will be called to the ordering clinician or representative by the Radiologist Assistant, and communication documented in the PACS or zVision  Dashboard. Electronically Signed   By: Bretta BangWilliam  Woodruff III M.D.   On: 12/25/2018 12:23      Procedures Procedures (including critical care time)  Medications Ordered in ED Medications  sodium chloride 0.9 % bolus 1,000 mL (0 mLs Intravenous Stopped 12/25/18 1936)     Initial Impression / Assessment and Plan / ED Course  I have reviewed the triage vital  signs and the nursing notes.  Pertinent labs & imaging results that were available during my care of the patient were reviewed by me and considered in my medical decision making (see chart for details).        Patient is a 33 year old female who presents with right lower quadrant abdominal pain.  She is status post appendectomy on October 11.  She reports that the pain is in the same location as where she had her appendicitis pain.  She is also has a decreased appetite over the last 3 days.  She had been doing well and was having relatively little pain prior to this event.  CT scan today showed some questionable inflammation around the right ovary versus torsion.  Pelvic ultrasound was performed which shows a 3 cm fluid collection adjacent to the right ovary but not confluent with the ovary.  Sounds concerning for a possible postop abscess given her worsening pain and decreased appetite.  Patient has no complaints of pelvic pain or vaginal discharge.  She does have a normal white count and does not have a fever.  I spoke with Dr. Maisie Fus who has evaluated the patient for general surgery and she does not feel that this is related to her appendectomy and recommends a GYN consult.  I spoke with Dr. Audie Box who is on-call for the patient's OB/GYN at [facility] gynecology.  He is coming to evaluate the patient in the emergency department.  Pt turned over to Angela Pear, PA-C    Final Clinical Impressions(s) / ED Diagnoses   Final diagnoses:  None    ED Discharge Orders    None       Rolan Bucco, MD 12/25/18 2222

## 2018-12-26 LAB — SARS CORONAVIRUS 2 (TAT 6-24 HRS): SARS Coronavirus 2: NEGATIVE

## 2018-12-26 MED ORDER — IBUPROFEN 600 MG PO TABS: 600.0000 mg | ORAL_TABLET | Freq: Four times a day (QID) | ORAL | 0 refills | Status: DC | PRN

## 2018-12-26 MED ORDER — PROMETHAZINE HCL 25 MG PO TABS: 25.0000 mg | ORAL_TABLET | Freq: Four times a day (QID) | ORAL | 0 refills | Status: DC | PRN

## 2018-12-26 NOTE — Discharge Instructions (Addendum)
Thank you for allowing me to care for you today in the Emergency Department.   Take 650 mg of Tylenol or 600 mg of ibuprofen with food every 6 hours for pain.  You can alternate between these 2 medications every 3 hours if your pain returns.  For instance, you can take Tylenol at noon, followed by a dose of ibuprofen at 3, followed by second dose of Tylenol and 6.  You can take 1 tablet of Phenergan by mouth every 6 hours as needed for nausea or vomiting.  Call central [place] [facility] surgery on Monday morning to schedule a follow-up appointment.  Make sure to let them know that you are following up as a postoperative patient who was seen in the ER over the weekend.  Return to the emergency department if you develop fevers, vomiting despite taking Phenergan, worsening, uncontrollable pain, or other new, concerning symptoms.

## 2018-12-26 NOTE — ED Notes (Signed)
Patient ambulatory to restroom with no assistance and steady gait.  

## 2018-12-28 LAB — URINE CULTURE: Culture: >=100000 — AB

## 2018-12-29 ENCOUNTER — Telehealth: Payer: Self-pay

## 2018-12-29 NOTE — Telephone Encounter (Signed)
No treatment for [facility] ED 12/26/2018 per Leodis Sias  Pharm D

## 2019-01-06 DIAGNOSIS — M9901 Segmental and somatic dysfunction of cervical region: Secondary | ICD-10-CM | POA: Diagnosis not present

## 2019-01-06 DIAGNOSIS — M9903 Segmental and somatic dysfunction of lumbar region: Secondary | ICD-10-CM | POA: Diagnosis not present

## 2019-01-06 DIAGNOSIS — M9902 Segmental and somatic dysfunction of thoracic region: Secondary | ICD-10-CM | POA: Diagnosis not present

## 2019-01-11 ENCOUNTER — Other Ambulatory Visit: Payer: Self-pay | Admitting: Obstetrics & Gynecology

## 2019-01-12 DIAGNOSIS — M9902 Segmental and somatic dysfunction of thoracic region: Secondary | ICD-10-CM | POA: Diagnosis not present

## 2019-01-12 DIAGNOSIS — M9903 Segmental and somatic dysfunction of lumbar region: Secondary | ICD-10-CM | POA: Diagnosis not present

## 2019-01-12 DIAGNOSIS — M9905 Segmental and somatic dysfunction of pelvic region: Secondary | ICD-10-CM | POA: Diagnosis not present

## 2019-01-12 DIAGNOSIS — M9901 Segmental and somatic dysfunction of cervical region: Secondary | ICD-10-CM | POA: Diagnosis not present

## 2019-01-19 DIAGNOSIS — M9902 Segmental and somatic dysfunction of thoracic region: Secondary | ICD-10-CM | POA: Diagnosis not present

## 2019-01-19 DIAGNOSIS — M9901 Segmental and somatic dysfunction of cervical region: Secondary | ICD-10-CM | POA: Diagnosis not present

## 2019-01-19 DIAGNOSIS — M9905 Segmental and somatic dysfunction of pelvic region: Secondary | ICD-10-CM | POA: Diagnosis not present

## 2019-01-19 DIAGNOSIS — M9903 Segmental and somatic dysfunction of lumbar region: Secondary | ICD-10-CM | POA: Diagnosis not present

## 2019-02-11 DIAGNOSIS — M9901 Segmental and somatic dysfunction of cervical region: Secondary | ICD-10-CM | POA: Diagnosis not present

## 2019-02-11 DIAGNOSIS — M9902 Segmental and somatic dysfunction of thoracic region: Secondary | ICD-10-CM | POA: Diagnosis not present

## 2019-02-11 DIAGNOSIS — M9905 Segmental and somatic dysfunction of pelvic region: Secondary | ICD-10-CM | POA: Diagnosis not present

## 2019-02-11 DIAGNOSIS — M9903 Segmental and somatic dysfunction of lumbar region: Secondary | ICD-10-CM | POA: Diagnosis not present

## 2019-03-01 ENCOUNTER — Other Ambulatory Visit: Payer: Self-pay | Admitting: Obstetrics & Gynecology

## 2019-03-15 DIAGNOSIS — M9901 Segmental and somatic dysfunction of cervical region: Secondary | ICD-10-CM | POA: Diagnosis not present

## 2019-03-15 DIAGNOSIS — M9905 Segmental and somatic dysfunction of pelvic region: Secondary | ICD-10-CM | POA: Diagnosis not present

## 2019-03-15 DIAGNOSIS — M9902 Segmental and somatic dysfunction of thoracic region: Secondary | ICD-10-CM | POA: Diagnosis not present

## 2019-03-15 DIAGNOSIS — M9903 Segmental and somatic dysfunction of lumbar region: Secondary | ICD-10-CM | POA: Diagnosis not present

## 2019-04-12 DIAGNOSIS — M9902 Segmental and somatic dysfunction of thoracic region: Secondary | ICD-10-CM | POA: Diagnosis not present

## 2019-04-12 DIAGNOSIS — M9905 Segmental and somatic dysfunction of pelvic region: Secondary | ICD-10-CM | POA: Diagnosis not present

## 2019-04-12 DIAGNOSIS — M9903 Segmental and somatic dysfunction of lumbar region: Secondary | ICD-10-CM | POA: Diagnosis not present

## 2019-04-12 DIAGNOSIS — M9901 Segmental and somatic dysfunction of cervical region: Secondary | ICD-10-CM | POA: Diagnosis not present

## 2019-05-03 ENCOUNTER — Other Ambulatory Visit: Payer: Self-pay

## 2019-05-04 ENCOUNTER — Encounter: Payer: Self-pay | Admitting: Obstetrics & Gynecology

## 2019-05-04 ENCOUNTER — Ambulatory Visit: Payer: BC Managed Care – PPO | Admitting: Obstetrics & Gynecology

## 2019-05-04 VITALS — BP 146/92 | Ht 68.0 in | Wt 209.0 lb

## 2019-05-04 DIAGNOSIS — Z3041 Encounter for surveillance of contraceptive pills: Secondary | ICD-10-CM

## 2019-05-04 DIAGNOSIS — Z01419 Encounter for gynecological examination (general) (routine) without abnormal findings: Secondary | ICD-10-CM

## 2019-05-04 DIAGNOSIS — Z1151 Encounter for screening for human papillomavirus (HPV): Secondary | ICD-10-CM

## 2019-05-04 DIAGNOSIS — E6609 Other obesity due to excess calories: Secondary | ICD-10-CM | POA: Diagnosis not present

## 2019-05-04 DIAGNOSIS — Z6831 Body mass index (BMI) 31.0-31.9, adult: Secondary | ICD-10-CM | POA: Diagnosis not present

## 2019-05-04 DIAGNOSIS — N870 Mild cervical dysplasia: Secondary | ICD-10-CM

## 2019-05-04 MED ORDER — BLISOVI 24 FE 1-20 MG-MCG(24) PO TABS: ORAL_TABLET | ORAL | 4 refills | Status: DC

## 2019-05-04 NOTE — Addendum Note (Signed)
Addended by: Berna Spare A on: 05/04/2019 04:52 PM   Modules accepted: Orders

## 2019-05-04 NOTE — Patient Instructions (Signed)
1. Encounter for routine gynecological examination with Papanicolaou smear of cervix Normal gynecologic exam.  Pap test with high-risk HPV done today.  Breast exam normal.  2. Encounter for surveillance of contraceptive pills Well on Blisovi 24 FE 1/20.  No contraindication to continue.  Prescription sent to pharmacy.  3. 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  Pap test with high-risk HPV done today.  4. Class 1 obesity due to excess calories without serious comorbidity with body mass index (BMI) of 31.0 to 31.9 in adult Patient is in good fitness.  Will work on nutrition with decreased calories/carbs to achieve weight loss.  Other orders - Norethindrone Acetate-Ethinyl Estrad-FE (BLISOVI 24 FE) 1-20 MG-MCG(24) tablet; TAKE 1 TABLET BY MOUTH DAILY. CONTINUOUS USE FOR SEVERE DYSMENORRHEA  Angela Joseph, it was a pleasure seeing you today!  I will inform you of your results as soon as they are available.

## 2019-05-04 NOTE — Progress Notes (Signed)
Angela Joseph [birth year] [id number]   History:    34 y.o. G0 Single  RP:  Established patient presenting for annual gyn exam   HPI: Well on Blisovi.  No BTB.  No Pelvic pain.  Currently abstinent.  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  HPV 16-18-45 negative.  Last Pap ASCUS/HPV HR Negative 11/2017.  Urine/BMs wnl.  Breasts wnl. BMI 31.78.  Physically active, ran 1/2 a Marathon 2 weeks ago.  Appendectomy 11/2018.   Past medical history,surgical history, family history and social history were all reviewed and documented in the EPIC chart.  Gynecologic History Patient's last menstrual period was 05/02/2019.  Obstetric History OB History  Gravida Para Term Preterm AB Living  0 0 0 0 0 0  SAB TAB Ectopic Multiple Live Births  0 0 0 0 0     ROS: A ROS was performed and pertinent positives and negatives are included in the history.  GENERAL: No fevers or chills. HEENT: No change in vision, no earache, sore throat or sinus congestion. NECK: No pain or stiffness. CARDIOVASCULAR: No chest pain or pressure. No palpitations. PULMONARY: No shortness of breath, cough or wheeze. GASTROINTESTINAL: No abdominal pain, nausea, vomiting or diarrhea, melena or bright red blood per rectum. GENITOURINARY: No urinary frequency, urgency, hesitancy or dysuria. MUSCULOSKELETAL: No joint or muscle pain, no back pain, no recent trauma. DERMATOLOGIC: No rash, no itching, no lesions. ENDOCRINE: No polyuria, polydipsia, no heat or cold intolerance. No recent change in weight. HEMATOLOGICAL: No anemia or easy bruising or bleeding. NEUROLOGIC: No headache, seizures, numbness, tingling or weakness. PSYCHIATRIC: No depression, no loss of interest in normal activity or change in sleep pattern.     Exam:   BP (!) 146/92   Ht 5\' 8"  (1.727 m)   Wt 209 lb (94.8 kg)   LMP 05/02/2019 Comment: pill  BMI 31.78 kg/m   Body mass index is 31.78 kg/m.  General appearance : Well developed well nourished female. No  acute distress HEENT: Eyes: no retinal hemorrhage or exudates,  Neck supple, trachea midline, no carotid bruits, no thyroidmegaly Lungs: Clear to auscultation, no rhonchi or wheezes, or rib retractions  Heart: Regular rate and rhythm, no murmurs or gallops Breast:Examined in sitting and supine position were symmetrical in appearance, no palpable masses or tenderness,  no skin retraction, no nipple inversion, no nipple discharge, no skin discoloration, no axillary or supraclavicular lymphadenopathy Abdomen: no palpable masses or tenderness, no rebound or guarding Extremities: no edema or skin discoloration or tenderness  Pelvic: Vulva: Normal             Vagina: No gross lesions or discharge  Cervix: No gross lesions or discharge.  Pap/HPV HR done.  Uterus  AV, normal size, shape and consistency, non-tender and mobile  Adnexa  Without masses or tenderness  Anus: Normal   Assessment/Plan:  34 y.o. female for annual exam    1. Encounter for routine gynecological examination with Papanicolaou smear of cervix Normal gynecologic exam.  Pap test with high-risk HPV done today.  Breast exam normal.  2. Encounter for surveillance of contraceptive pills Well on Blisovi 24 FE 1/20.  No contraindication to continue.  Prescription sent to pharmacy.  3. 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  Pap test with high-risk HPV done today.  4. Class 1 obesity due to excess calories without serious comorbidity with body mass index (BMI) of 31.0 to 31.9 in adult Patient is in good fitness.  Will work on nutrition with decreased calories/carbs to achieve weight loss.  Other orders - Norethindrone Acetate-Ethinyl Estrad-FE (BLISOVI 24 FE) 1-20 MG-MCG(24) tablet; TAKE 1 TABLET BY MOUTH DAILY. CONTINUOUS USE FOR SEVERE DYSMENORRHEA  Princess Bruins MD, 4:19 PM 05/04/2019

## 2019-05-05 DIAGNOSIS — Z01419 Encounter for gynecological examination (general) (routine) without abnormal findings: Secondary | ICD-10-CM | POA: Diagnosis not present

## 2019-05-05 DIAGNOSIS — Z1151 Encounter for screening for human papillomavirus (HPV): Secondary | ICD-10-CM | POA: Diagnosis not present

## 2019-05-07 LAB — PAP, TP IMAGING W/ HPV RNA, RFLX HPV TYPE 16,18/45: HPV DNA High Risk: NOT DETECTED

## 2019-05-10 DIAGNOSIS — M9902 Segmental and somatic dysfunction of thoracic region: Secondary | ICD-10-CM | POA: Diagnosis not present

## 2019-05-10 DIAGNOSIS — M9903 Segmental and somatic dysfunction of lumbar region: Secondary | ICD-10-CM | POA: Diagnosis not present

## 2019-05-10 DIAGNOSIS — M9905 Segmental and somatic dysfunction of pelvic region: Secondary | ICD-10-CM | POA: Diagnosis not present

## 2019-05-10 DIAGNOSIS — M9901 Segmental and somatic dysfunction of cervical region: Secondary | ICD-10-CM | POA: Diagnosis not present

## 2019-07-13 DIAGNOSIS — M9903 Segmental and somatic dysfunction of lumbar region: Secondary | ICD-10-CM | POA: Diagnosis not present

## 2019-07-13 DIAGNOSIS — M9901 Segmental and somatic dysfunction of cervical region: Secondary | ICD-10-CM | POA: Diagnosis not present

## 2019-07-13 DIAGNOSIS — M9905 Segmental and somatic dysfunction of pelvic region: Secondary | ICD-10-CM | POA: Diagnosis not present

## 2019-07-13 DIAGNOSIS — M9902 Segmental and somatic dysfunction of thoracic region: Secondary | ICD-10-CM | POA: Diagnosis not present

## 2019-08-10 DIAGNOSIS — M9903 Segmental and somatic dysfunction of lumbar region: Secondary | ICD-10-CM | POA: Diagnosis not present

## 2019-08-10 DIAGNOSIS — M9905 Segmental and somatic dysfunction of pelvic region: Secondary | ICD-10-CM | POA: Diagnosis not present

## 2019-08-10 DIAGNOSIS — M9902 Segmental and somatic dysfunction of thoracic region: Secondary | ICD-10-CM | POA: Diagnosis not present

## 2019-08-10 DIAGNOSIS — M9901 Segmental and somatic dysfunction of cervical region: Secondary | ICD-10-CM | POA: Diagnosis not present

## 2019-08-25 DIAGNOSIS — M9902 Segmental and somatic dysfunction of thoracic region: Secondary | ICD-10-CM | POA: Diagnosis not present

## 2019-08-25 DIAGNOSIS — M9903 Segmental and somatic dysfunction of lumbar region: Secondary | ICD-10-CM | POA: Diagnosis not present

## 2019-08-25 DIAGNOSIS — M9901 Segmental and somatic dysfunction of cervical region: Secondary | ICD-10-CM | POA: Diagnosis not present

## 2019-08-25 DIAGNOSIS — M9905 Segmental and somatic dysfunction of pelvic region: Secondary | ICD-10-CM | POA: Diagnosis not present

## 2019-09-01 DIAGNOSIS — M9903 Segmental and somatic dysfunction of lumbar region: Secondary | ICD-10-CM | POA: Diagnosis not present

## 2019-09-01 DIAGNOSIS — M9902 Segmental and somatic dysfunction of thoracic region: Secondary | ICD-10-CM | POA: Diagnosis not present

## 2019-09-01 DIAGNOSIS — M9901 Segmental and somatic dysfunction of cervical region: Secondary | ICD-10-CM | POA: Diagnosis not present

## 2019-09-01 DIAGNOSIS — M9905 Segmental and somatic dysfunction of pelvic region: Secondary | ICD-10-CM | POA: Diagnosis not present

## 2019-09-03 DIAGNOSIS — M9903 Segmental and somatic dysfunction of lumbar region: Secondary | ICD-10-CM | POA: Diagnosis not present

## 2019-09-03 DIAGNOSIS — M9905 Segmental and somatic dysfunction of pelvic region: Secondary | ICD-10-CM | POA: Diagnosis not present

## 2019-09-03 DIAGNOSIS — M9902 Segmental and somatic dysfunction of thoracic region: Secondary | ICD-10-CM | POA: Diagnosis not present

## 2019-09-03 DIAGNOSIS — M9901 Segmental and somatic dysfunction of cervical region: Secondary | ICD-10-CM | POA: Diagnosis not present

## 2019-09-07 DIAGNOSIS — M9902 Segmental and somatic dysfunction of thoracic region: Secondary | ICD-10-CM | POA: Diagnosis not present

## 2019-09-07 DIAGNOSIS — M9905 Segmental and somatic dysfunction of pelvic region: Secondary | ICD-10-CM | POA: Diagnosis not present

## 2019-09-07 DIAGNOSIS — M9901 Segmental and somatic dysfunction of cervical region: Secondary | ICD-10-CM | POA: Diagnosis not present

## 2019-09-07 DIAGNOSIS — M9903 Segmental and somatic dysfunction of lumbar region: Secondary | ICD-10-CM | POA: Diagnosis not present

## 2019-09-10 DIAGNOSIS — M9901 Segmental and somatic dysfunction of cervical region: Secondary | ICD-10-CM | POA: Diagnosis not present

## 2019-09-10 DIAGNOSIS — M9902 Segmental and somatic dysfunction of thoracic region: Secondary | ICD-10-CM | POA: Diagnosis not present

## 2019-09-10 DIAGNOSIS — M9905 Segmental and somatic dysfunction of pelvic region: Secondary | ICD-10-CM | POA: Diagnosis not present

## 2019-09-10 DIAGNOSIS — M9903 Segmental and somatic dysfunction of lumbar region: Secondary | ICD-10-CM | POA: Diagnosis not present

## 2019-09-14 DIAGNOSIS — M9902 Segmental and somatic dysfunction of thoracic region: Secondary | ICD-10-CM | POA: Diagnosis not present

## 2019-09-14 DIAGNOSIS — M9901 Segmental and somatic dysfunction of cervical region: Secondary | ICD-10-CM | POA: Diagnosis not present

## 2019-09-14 DIAGNOSIS — M9903 Segmental and somatic dysfunction of lumbar region: Secondary | ICD-10-CM | POA: Diagnosis not present

## 2019-09-14 DIAGNOSIS — M9905 Segmental and somatic dysfunction of pelvic region: Secondary | ICD-10-CM | POA: Diagnosis not present

## 2019-09-17 DIAGNOSIS — M9905 Segmental and somatic dysfunction of pelvic region: Secondary | ICD-10-CM | POA: Diagnosis not present

## 2019-09-17 DIAGNOSIS — M9902 Segmental and somatic dysfunction of thoracic region: Secondary | ICD-10-CM | POA: Diagnosis not present

## 2019-09-17 DIAGNOSIS — M9901 Segmental and somatic dysfunction of cervical region: Secondary | ICD-10-CM | POA: Diagnosis not present

## 2019-09-17 DIAGNOSIS — M9903 Segmental and somatic dysfunction of lumbar region: Secondary | ICD-10-CM | POA: Diagnosis not present

## 2019-09-20 ENCOUNTER — Other Ambulatory Visit: Payer: Self-pay

## 2019-09-20 ENCOUNTER — Encounter (HOSPITAL_COMMUNITY): Payer: Self-pay | Admitting: Emergency Medicine

## 2019-09-20 ENCOUNTER — Ambulatory Visit (HOSPITAL_COMMUNITY)
Admission: EM | Admit: 2019-09-20 | Discharge: 2019-09-20 | Disposition: A | Discharge status: Home / Self Care | Payer: BC Managed Care – PPO | Attending: Physician Assistant | Admitting: Physician Assistant

## 2019-09-20 DIAGNOSIS — Z793 Long term (current) use of hormonal contraceptives: Secondary | ICD-10-CM | POA: Diagnosis not present

## 2019-09-20 DIAGNOSIS — Z79899 Other long term (current) drug therapy: Secondary | ICD-10-CM | POA: Diagnosis not present

## 2019-09-20 DIAGNOSIS — Z791 Long term (current) use of non-steroidal anti-inflammatories (NSAID): Secondary | ICD-10-CM | POA: Insufficient documentation

## 2019-09-20 DIAGNOSIS — Z880 Allergy status to penicillin: Secondary | ICD-10-CM | POA: Diagnosis not present

## 2019-09-20 DIAGNOSIS — J069 Acute upper respiratory infection, unspecified: Secondary | ICD-10-CM

## 2019-09-20 DIAGNOSIS — Z20822 Contact with and (suspected) exposure to covid-19: Secondary | ICD-10-CM | POA: Insufficient documentation

## 2019-09-20 DIAGNOSIS — Z881 Allergy status to other antibiotic agents status: Secondary | ICD-10-CM | POA: Insufficient documentation

## 2019-09-20 DIAGNOSIS — Z888 Allergy status to other drugs, medicaments and biological substances status: Secondary | ICD-10-CM | POA: Diagnosis not present

## 2019-09-20 LAB — SARS CORONAVIRUS 2 (TAT 6-24 HRS): SARS Coronavirus 2: NEGATIVE

## 2019-09-20 MED ORDER — SALINE SPRAY 0.65 % NA SOLN: 1.0000 | NASAL | 0 refills | Status: DC | PRN

## 2019-09-20 MED ORDER — BENZONATATE 100 MG PO CAPS: 100.0000 mg | ORAL_CAPSULE | Freq: Three times a day (TID) | ORAL | 0 refills | Status: DC | PRN

## 2019-09-20 MED ORDER — FLUTICASONE PROPIONATE 50 MCG/ACT NA SUSP: 1.0000 | Freq: Every day | NASAL | 0 refills | Status: DC

## 2019-09-20 MED ORDER — GUAIFENESIN ER 600 MG PO TB12: 600.0000 mg | ORAL_TABLET | Freq: Two times a day (BID) | ORAL | 0 refills | Status: AC

## 2019-09-20 MED ORDER — CETIRIZINE HCL 10 MG PO TABS: 10.0000 mg | ORAL_TABLET | Freq: Every day | ORAL | 0 refills | Status: DC

## 2019-09-20 NOTE — ED Provider Notes (Signed)
[facility]    CSN: [id number] Arrival date & time: 09/20/19  1108      History   Chief Complaint Chief Complaint  Patient presents with  . Sinus Problem    HPI Angela Joseph is a 34 y.o. female.   Patient presents for 2 days of nasal congestion and facial pressure.  She reports she is also developed a slight cough worse when laying down.  Denies shortness of breath.  She also reports sore throat that is developed over the last 24 hours.  Sore throat is not severe.  She has been eating and drinking however things do not taste well.  No vomiting.  No diarrhea.  No abdominal pain.  She has tried over-the-counter medicines without much relief. Denies fever and chills.     Past Medical History:  Diagnosis Date  . Allergy   . Migraine     Patient Active Problem List   Diagnosis Date Noted  . Acute appendicitis 12/06/2018    Past Surgical History:  Procedure Laterality Date  . DENTAL SURGERY    . KNEE SURGERY    . LAPAROSCOPIC APPENDECTOMY N/A 12/06/2018   Procedure: APPENDECTOMY LAPAROSCOPIC;  Surgeon: Harriette Bouillon, MD;  Location: [facility];  Service: General;  Laterality: N/A;    OB History    Gravida  0   Para  0   Term  0   Preterm  0   AB  0   Living  0     SAB  0   TAB  0   Ectopic  0   Multiple  0   Live Births  0            Home Medications    Prior to Admission medications   Medication Sig Start Date End Date Taking? Authorizing Provider  ibuprofen (ADVIL) 600 MG tablet Take 1 tablet (600 mg total) by mouth every 6 (six) hours as needed. 12/26/18  Yes McDonald, Mia A, PA-C  Norethindrone Acetate-Ethinyl Estrad-FE (BLISOVI 24 FE) 1-20 MG-MCG(24) tablet TAKE 1 TABLET BY MOUTH DAILY. CONTINUOUS USE FOR SEVERE DYSMENORRHEA 05/04/19  Yes [name], MD  benzonatate (TESSALON) 100 MG capsule Take 1 capsule (100 mg total) by mouth 3 (three) times daily as needed for cough. 09/20/19   Tikia Skilton, Veryl Speak, PA-C  cetirizine (ZYRTEC  ALLERGY) 10 MG tablet Take 1 tablet (10 mg total) by mouth daily. 09/20/19   Jaci Desanto, Veryl Speak, PA-C  fluticasone (FLONASE) 50 MCG/ACT nasal spray Place 1 spray into both nostrils daily. 09/20/19   Mekai Wilkinson, Veryl Speak, PA-C  guaiFENesin (MUCINEX) 600 MG 12 hr tablet Take 1 tablet (600 mg total) by mouth 2 (two) times daily for 7 days. 09/20/19 09/27/19  Claretha Townshend, Veryl Speak, PA-C  sodium chloride (OCEAN) 0.65 % SOLN nasal spray Place 1 spray into both nostrils as needed for congestion. 09/20/19   Zabdi Mis, Veryl Speak, PA-C    Family History Family History  Problem Relation Age of Onset  . Hypertension Father   . Cancer Paternal Grandmother     Social History Social History   Tobacco Use  . Smoking status: Never Smoker  . Smokeless tobacco: Never Used  Vaping Use  . Vaping Use: Never used  Substance Use Topics  . Alcohol use: Yes  . Drug use: No     Allergies   Tamiflu [oseltamivir phosphate], Bee venom, Ceclor [cefaclor], Cleocin [clindamycin hcl], Other, Penicillins, and Zinc   Review of Systems Review of Systems  PER HPI  Physical Exam Triage Vital Signs ED Triage Vitals  Enc Vitals Group     BP 09/20/19 1239 (!) 151/118     Pulse Rate 09/20/19 1239 98     Resp 09/20/19 1239 17     Temp 09/20/19 1239 99 F (37.2 C)     Temp Source 09/20/19 1239 Oral     SpO2 09/20/19 1239 100 %     Weight --      Height --      Head Circumference --      Peak Flow --      Pain Score 09/20/19 1236 4     Pain Loc --      Pain Edu? --      Excl. in GC? --    No data found.  Updated Vital Signs BP (!) 151/118 (BP Location: Left Arm)   Pulse 98   Temp 99 F (37.2 C) (Oral)   Resp 17   SpO2 100%   Visual Acuity Right Eye Distance:   Left Eye Distance:   Bilateral Distance:    Right Eye Near:   Left Eye Near:    Bilateral Near:     Physical Exam Vitals and nursing note reviewed.  Constitutional:      General: She is not in acute distress.    Appearance: She is well-developed.  HENT:      Head: Normocephalic and atraumatic.     Comments: She does endorse some maxillary sinus tenderness bilaterally    Right Ear: Tympanic membrane normal.     Left Ear: Tympanic membrane normal.     Nose: Congestion present.     Mouth/Throat:     Mouth: Mucous membranes are moist.     Pharynx: No oropharyngeal exudate or posterior oropharyngeal erythema.     Comments: Postnasal drip in the oropharynx Eyes:     Extraocular Movements: Extraocular movements intact.     Conjunctiva/sclera: Conjunctivae normal.     Pupils: Pupils are equal, round, and reactive to light.  Cardiovascular:     Rate and Rhythm: Normal rate and regular rhythm.     Heart sounds: No murmur heard.   Pulmonary:     Effort: Pulmonary effort is normal. No respiratory distress.     Breath sounds: Normal breath sounds.  Abdominal:     Palpations: Abdomen is soft.     Tenderness: There is no abdominal tenderness.  Musculoskeletal:     Cervical back: Normal range of motion and neck supple. No tenderness.  Lymphadenopathy:     Cervical: No cervical adenopathy.  Skin:    General: Skin is warm and dry.  Neurological:     Mental Status: She is alert.      UC Treatments / Results  Labs (all labs ordered are listed, but only abnormal results are displayed) Labs Reviewed  SARS CORONAVIRUS 2 (TAT 6-24 HRS)    EKG   Radiology No results found.  Procedures Procedures (including critical care time)  Medications Ordered in UC Medications - No data to display  Initial Impression / Assessment and Plan / UC Course  I have reviewed the triage vital signs and the nursing notes.  Pertinent labs & imaging results that were available during my care of the patient were reviewed by me and considered in my medical decision making (see chart for details).     #Viral URI Patient is 34 year old presenting with viral URI. Given only 2 days of symptoms and otherwise healthy, will start on symptomatic  treatments. COVID test  sent. Discussed follow up and return precautions. Patient verbalized understanding plant of care.  Final Clinical Impressions(s) / UC Diagnoses   Final diagnoses:  Viral URI     Discharge Instructions     Take medicines as prescribed -Flonase daily -Zyrtec daily -Nasal saline throughout the day -Mucinex twice a day for 1 week -Tessalon cough medicine as needed  Hydrate well  If not improving over the next week please return  If your Covid-19 test is positive, you will receive a phone call from [facility] regarding your results. Negative test results are not called. Both positive and negative results area always visible on MyChart. If you do not have a MyChart account, sign up instructions are in your discharge papers.   Persons who are directed to care for themselves at home may discontinue isolation under the following conditions:  . At least 10 days have passed since symptom onset and . At least 24 hours have passed without running a fever (this means without the use of fever-reducing medications) and . Other symptoms have improved.  Persons infected with COVID-19 who never develop symptoms may discontinue isolation and other precautions 10 days after the date of their first positive COVID-19 test.      ED Prescriptions    Medication Sig Dispense Auth. Provider   fluticasone (FLONASE) 50 MCG/ACT nasal spray Place 1 spray into both nostrils daily. 15.8 mL Kelsa Jaworowski, Veryl Speak, PA-C   cetirizine (ZYRTEC ALLERGY) 10 MG tablet Take 1 tablet (10 mg total) by mouth daily. 30 tablet Dewanda Fennema, Veryl Speak, PA-C   guaiFENesin (MUCINEX) 600 MG 12 hr tablet Take 1 tablet (600 mg total) by mouth 2 (two) times daily for 7 days. 14 tablet Yancarlos Berthold, Veryl Speak, PA-C   sodium chloride (OCEAN) 0.65 % SOLN nasal spray Place 1 spray into both nostrils as needed for congestion. 44 mL Patria Warzecha, Veryl Speak, PA-C   benzonatate (TESSALON) 100 MG capsule Take 1 capsule (100 mg total) by mouth 3 (three) times daily as needed  for cough. 21 capsule Helon Wisinski, Veryl Speak, PA-C     PDMP not reviewed this encounter.   Hermelinda Medicus, PA-C 09/21/19 0000

## 2019-09-20 NOTE — Discharge Instructions (Signed)
Take medicines as prescribed -Flonase daily -Zyrtec daily -Nasal saline throughout the day -Mucinex twice a day for 1 week -Tessalon cough medicine as needed  Hydrate well  If not improving over the next week please return  If your Covid-19 test is positive, you will receive a phone call from [facility] regarding your results. Negative test results are not called. Both positive and negative results area always visible on MyChart. If you do not have a MyChart account, sign up instructions are in your discharge papers.   Persons who are directed to care for themselves at home may discontinue isolation under the following conditions:   At least 10 days have passed since symptom onset and  At least 24 hours have passed without running a fever (this means without the use of fever-reducing medications) and  Other symptoms have improved.  Persons infected with COVID-19 who never develop symptoms may discontinue isolation and other precautions 10 days after the date of their first positive COVID-19 test.

## 2019-09-20 NOTE — ED Triage Notes (Signed)
Patient presents to urgent care today with symptoms of sinus pressure and facial pain with nasal congestion and runny nose. Symptoms began on 2 days. They have tried a hot shower with steam with some relief of symptoms.

## 2019-09-21 DIAGNOSIS — M9903 Segmental and somatic dysfunction of lumbar region: Secondary | ICD-10-CM | POA: Diagnosis not present

## 2019-09-21 DIAGNOSIS — M9901 Segmental and somatic dysfunction of cervical region: Secondary | ICD-10-CM | POA: Diagnosis not present

## 2019-09-21 DIAGNOSIS — M9902 Segmental and somatic dysfunction of thoracic region: Secondary | ICD-10-CM | POA: Diagnosis not present

## 2019-09-21 DIAGNOSIS — M9905 Segmental and somatic dysfunction of pelvic region: Secondary | ICD-10-CM | POA: Diagnosis not present

## 2019-09-24 DIAGNOSIS — M9901 Segmental and somatic dysfunction of cervical region: Secondary | ICD-10-CM | POA: Diagnosis not present

## 2019-09-24 DIAGNOSIS — M9903 Segmental and somatic dysfunction of lumbar region: Secondary | ICD-10-CM | POA: Diagnosis not present

## 2019-09-24 DIAGNOSIS — M9905 Segmental and somatic dysfunction of pelvic region: Secondary | ICD-10-CM | POA: Diagnosis not present

## 2019-09-24 DIAGNOSIS — M9902 Segmental and somatic dysfunction of thoracic region: Secondary | ICD-10-CM | POA: Diagnosis not present

## 2019-10-04 DIAGNOSIS — M9902 Segmental and somatic dysfunction of thoracic region: Secondary | ICD-10-CM | POA: Diagnosis not present

## 2019-10-04 DIAGNOSIS — M9905 Segmental and somatic dysfunction of pelvic region: Secondary | ICD-10-CM | POA: Diagnosis not present

## 2019-10-04 DIAGNOSIS — M9903 Segmental and somatic dysfunction of lumbar region: Secondary | ICD-10-CM | POA: Diagnosis not present

## 2019-10-04 DIAGNOSIS — M9901 Segmental and somatic dysfunction of cervical region: Secondary | ICD-10-CM | POA: Diagnosis not present

## 2019-10-07 DIAGNOSIS — M9905 Segmental and somatic dysfunction of pelvic region: Secondary | ICD-10-CM | POA: Diagnosis not present

## 2019-10-07 DIAGNOSIS — M9903 Segmental and somatic dysfunction of lumbar region: Secondary | ICD-10-CM | POA: Diagnosis not present

## 2019-10-07 DIAGNOSIS — M9901 Segmental and somatic dysfunction of cervical region: Secondary | ICD-10-CM | POA: Diagnosis not present

## 2019-10-07 DIAGNOSIS — M9902 Segmental and somatic dysfunction of thoracic region: Secondary | ICD-10-CM | POA: Diagnosis not present

## 2019-10-12 DIAGNOSIS — M9901 Segmental and somatic dysfunction of cervical region: Secondary | ICD-10-CM | POA: Diagnosis not present

## 2019-10-12 DIAGNOSIS — M9902 Segmental and somatic dysfunction of thoracic region: Secondary | ICD-10-CM | POA: Diagnosis not present

## 2019-10-12 DIAGNOSIS — M9903 Segmental and somatic dysfunction of lumbar region: Secondary | ICD-10-CM | POA: Diagnosis not present

## 2019-10-12 DIAGNOSIS — M9905 Segmental and somatic dysfunction of pelvic region: Secondary | ICD-10-CM | POA: Diagnosis not present

## 2019-10-21 DIAGNOSIS — M9901 Segmental and somatic dysfunction of cervical region: Secondary | ICD-10-CM | POA: Diagnosis not present

## 2019-10-21 DIAGNOSIS — M9905 Segmental and somatic dysfunction of pelvic region: Secondary | ICD-10-CM | POA: Diagnosis not present

## 2019-10-21 DIAGNOSIS — M9903 Segmental and somatic dysfunction of lumbar region: Secondary | ICD-10-CM | POA: Diagnosis not present

## 2019-10-21 DIAGNOSIS — M9902 Segmental and somatic dysfunction of thoracic region: Secondary | ICD-10-CM | POA: Diagnosis not present

## 2019-11-04 DIAGNOSIS — M9902 Segmental and somatic dysfunction of thoracic region: Secondary | ICD-10-CM | POA: Diagnosis not present

## 2019-11-04 DIAGNOSIS — M9901 Segmental and somatic dysfunction of cervical region: Secondary | ICD-10-CM | POA: Diagnosis not present

## 2019-11-04 DIAGNOSIS — M9903 Segmental and somatic dysfunction of lumbar region: Secondary | ICD-10-CM | POA: Diagnosis not present

## 2019-11-04 DIAGNOSIS — M9905 Segmental and somatic dysfunction of pelvic region: Secondary | ICD-10-CM | POA: Diagnosis not present

## 2019-11-19 DIAGNOSIS — M9903 Segmental and somatic dysfunction of lumbar region: Secondary | ICD-10-CM | POA: Diagnosis not present

## 2019-11-19 DIAGNOSIS — M9905 Segmental and somatic dysfunction of pelvic region: Secondary | ICD-10-CM | POA: Diagnosis not present

## 2019-11-19 DIAGNOSIS — M9902 Segmental and somatic dysfunction of thoracic region: Secondary | ICD-10-CM | POA: Diagnosis not present

## 2019-11-19 DIAGNOSIS — M9901 Segmental and somatic dysfunction of cervical region: Secondary | ICD-10-CM | POA: Diagnosis not present

## 2019-11-21 IMAGING — CT CT ABD-PELV W/ CM
2 of 4 series · 16 of 46 positions shown, 18 images · IV contrast (ISOVUE)
Comparison: None.

CLINICAL DATA: Right lower quadrant pain

EXAM:
CT ABDOMEN AND PELVIS WITH CONTRAST
TECHNIQUE: Multidetector CT imaging of the abdomen and pelvis was performed
using the standard protocol following bolus administration of
intravenous contrast.
CONTRAST:  100mL 99V3H4-LPP IOPAMIDOL (99V3H4-LPP) INJECTION 61%

[Series 2: axial st · axial · 0.68mm/px · z∈[-350,+70]mm · 13 of 94 slices shown, 15 images]
[im 5/94  soft-tissue]
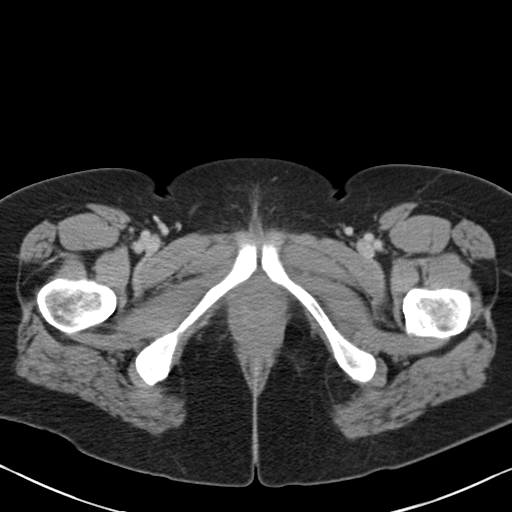
[im 5/94  bone]
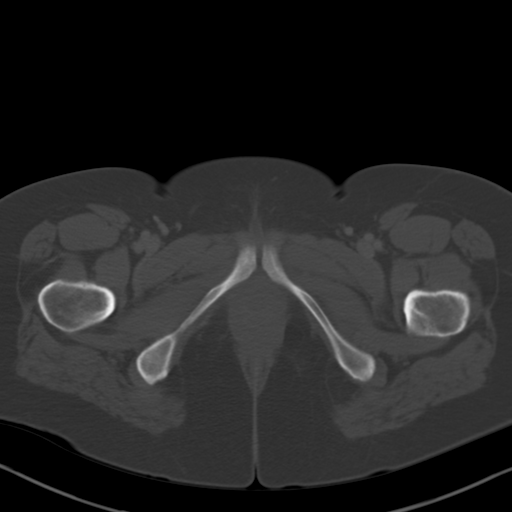
[im 15/94  soft-tissue]
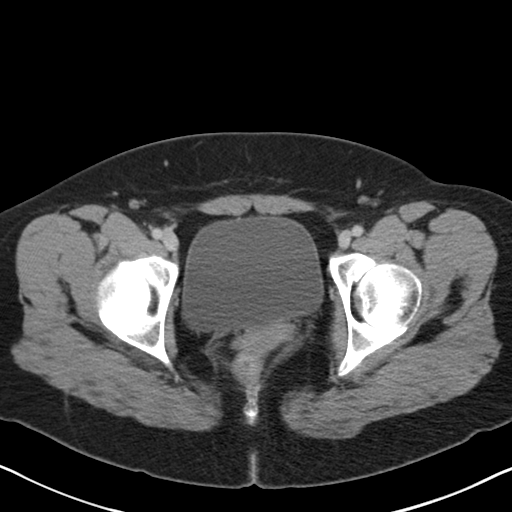
[im 20/94  soft-tissue]
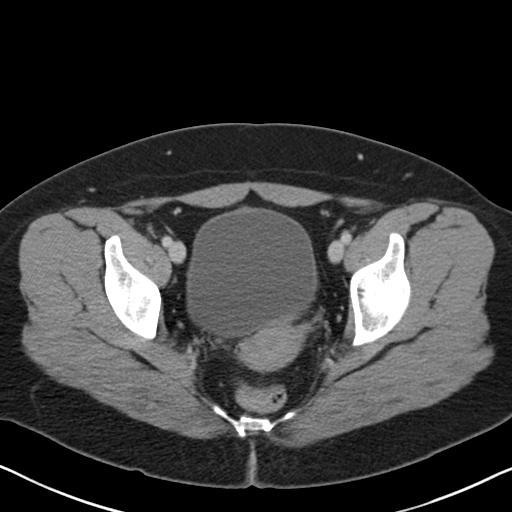
[im 25/94  soft-tissue]
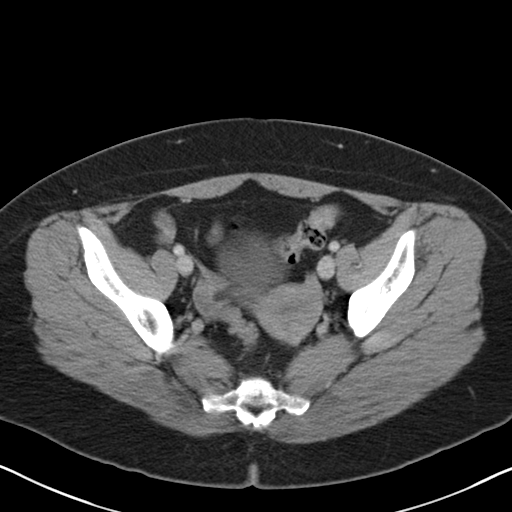
[im 35/94  soft-tissue]
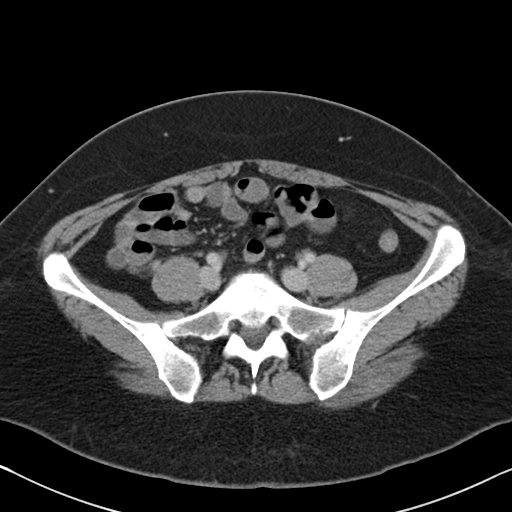
[im 40/94  soft-tissue]
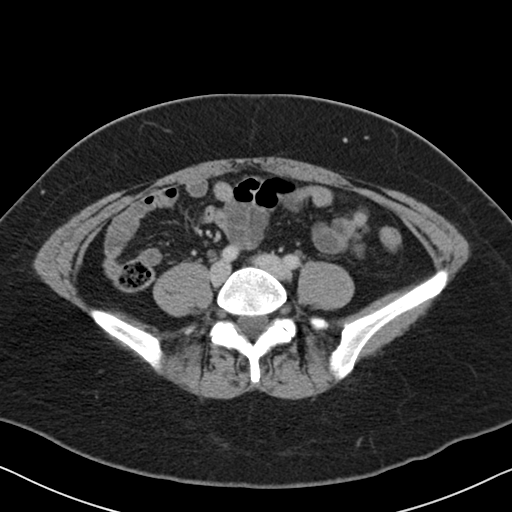
[im 49/94  soft-tissue]
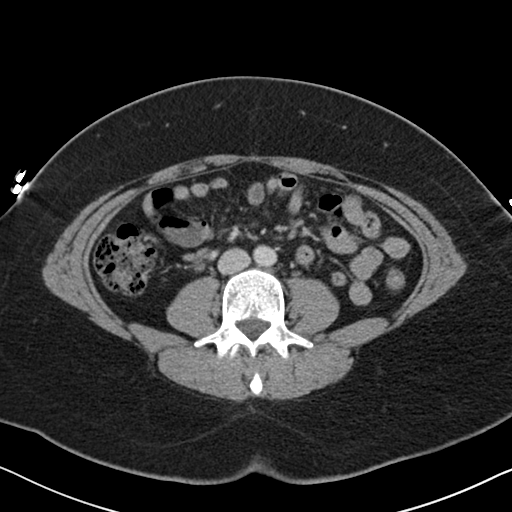
[im 54/94  soft-tissue]
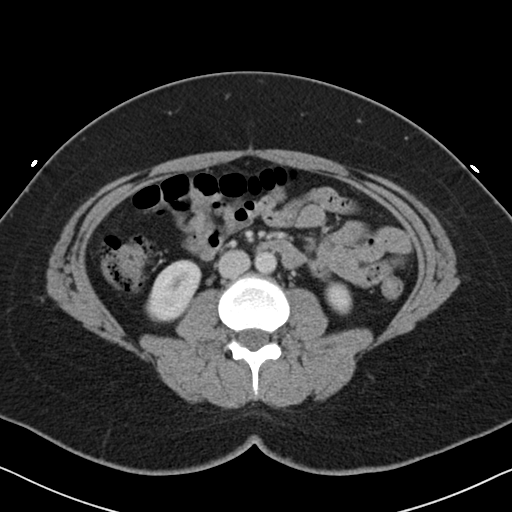
[im 59/94  soft-tissue]
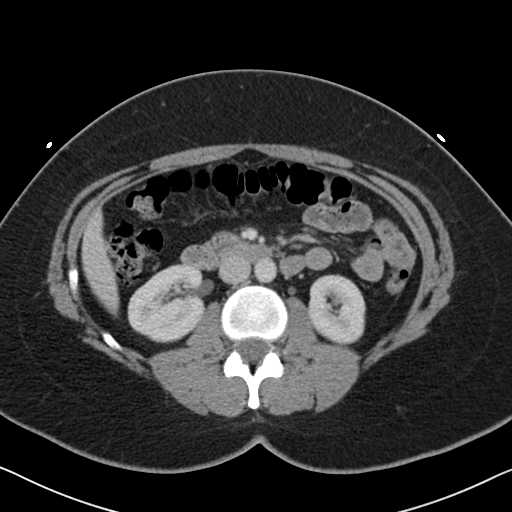
[im 59/94  bone]
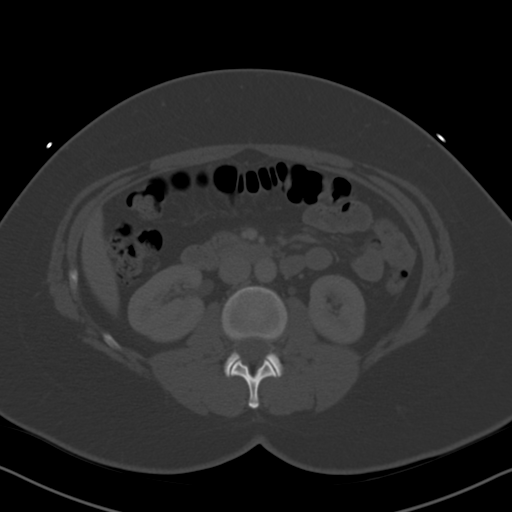
[im 69/94  soft-tissue]
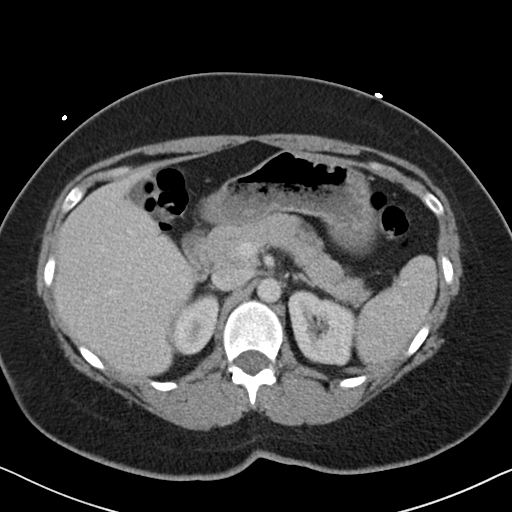
[im 74/94  soft-tissue]
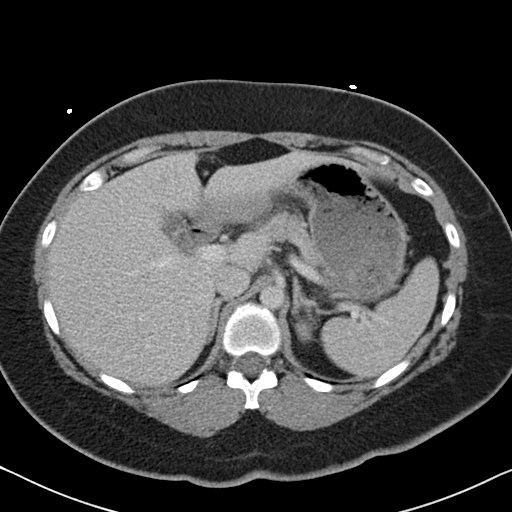
[im 79/94  soft-tissue]
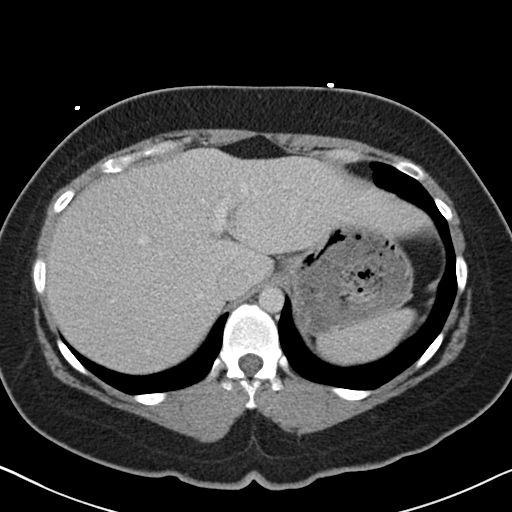
[im 89/94  soft-tissue]
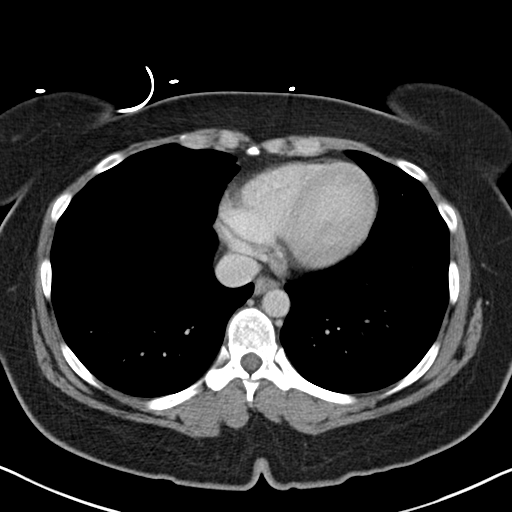

[Series 5: coronal st · coronal · 0.69mm/px · 3 of 84 slices shown]
[im 28/84  soft-tissue]
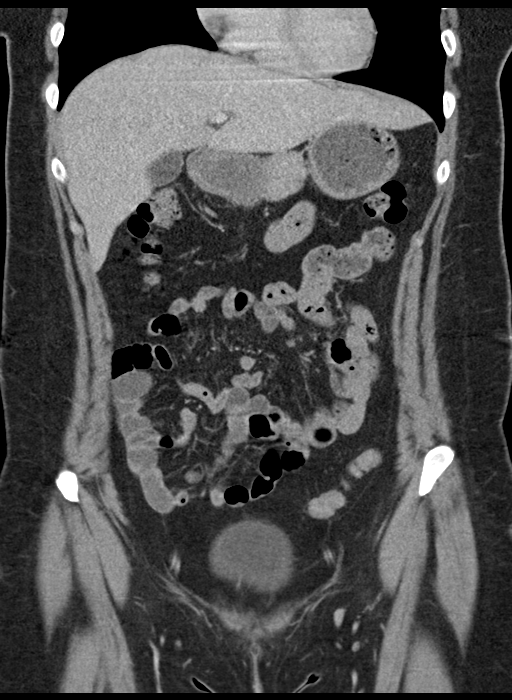
[im 37/84  soft-tissue]
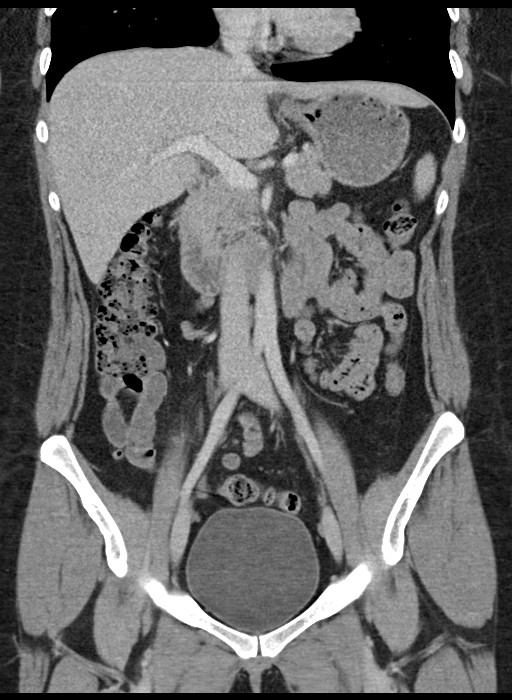
[im 47/84  soft-tissue]
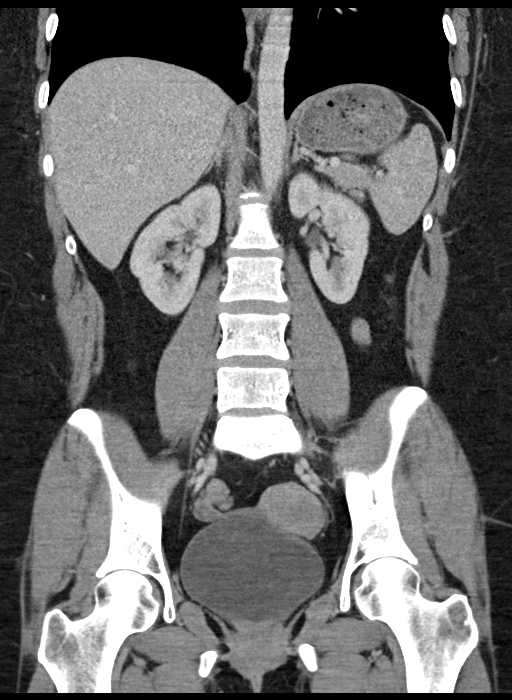

[16 of 46 positions shown; findings below may reference images not displayed]

FINDINGS: Lower chest: No acute abnormality.

Hepatobiliary: No focal liver abnormality is seen. No gallstones,
gallbladder wall thickening, or biliary dilatation.

Pancreas: Unremarkable. No pancreatic ductal dilatation or
surrounding inflammatory changes.

Spleen: Normal in size without focal abnormality.

Adrenals/Urinary Tract: Adrenal glands are unremarkable. Kidneys are
normal, without renal calculi, focal lesion, or hydronephrosis.
Bladder is unremarkable.

Stomach/Bowel: Stomach is within normal limits. Appendix appears
normal. No evidence of bowel wall thickening, distention, or
inflammatory changes.

Vascular/Lymphatic: No significant vascular findings are present. No
enlarged abdominal or pelvic lymph nodes.

Reproductive: Uterus and bilateral adnexa are unremarkable.

Other: No abdominal wall hernia or abnormality. No abdominopelvic
ascites.

Musculoskeletal: No acute or significant osseous findings.
IMPRESSION: 1. Normal appendix.
2. No acute abdominal or pelvic pathology.

## 2019-12-10 DIAGNOSIS — M9903 Segmental and somatic dysfunction of lumbar region: Secondary | ICD-10-CM | POA: Diagnosis not present

## 2019-12-10 DIAGNOSIS — M9901 Segmental and somatic dysfunction of cervical region: Secondary | ICD-10-CM | POA: Diagnosis not present

## 2019-12-10 DIAGNOSIS — M9905 Segmental and somatic dysfunction of pelvic region: Secondary | ICD-10-CM | POA: Diagnosis not present

## 2019-12-10 DIAGNOSIS — M9902 Segmental and somatic dysfunction of thoracic region: Secondary | ICD-10-CM | POA: Diagnosis not present

## 2019-12-24 DIAGNOSIS — M9903 Segmental and somatic dysfunction of lumbar region: Secondary | ICD-10-CM | POA: Diagnosis not present

## 2019-12-24 DIAGNOSIS — M9905 Segmental and somatic dysfunction of pelvic region: Secondary | ICD-10-CM | POA: Diagnosis not present

## 2019-12-24 DIAGNOSIS — M9901 Segmental and somatic dysfunction of cervical region: Secondary | ICD-10-CM | POA: Diagnosis not present

## 2019-12-24 DIAGNOSIS — M9902 Segmental and somatic dysfunction of thoracic region: Secondary | ICD-10-CM | POA: Diagnosis not present

## 2020-01-13 DIAGNOSIS — M9902 Segmental and somatic dysfunction of thoracic region: Secondary | ICD-10-CM | POA: Diagnosis not present

## 2020-01-13 DIAGNOSIS — M9901 Segmental and somatic dysfunction of cervical region: Secondary | ICD-10-CM | POA: Diagnosis not present

## 2020-01-13 DIAGNOSIS — M9905 Segmental and somatic dysfunction of pelvic region: Secondary | ICD-10-CM | POA: Diagnosis not present

## 2020-01-13 DIAGNOSIS — M9903 Segmental and somatic dysfunction of lumbar region: Secondary | ICD-10-CM | POA: Diagnosis not present

## 2020-02-03 DIAGNOSIS — M9901 Segmental and somatic dysfunction of cervical region: Secondary | ICD-10-CM | POA: Diagnosis not present

## 2020-02-03 DIAGNOSIS — M9903 Segmental and somatic dysfunction of lumbar region: Secondary | ICD-10-CM | POA: Diagnosis not present

## 2020-02-03 DIAGNOSIS — M9905 Segmental and somatic dysfunction of pelvic region: Secondary | ICD-10-CM | POA: Diagnosis not present

## 2020-02-03 DIAGNOSIS — M9902 Segmental and somatic dysfunction of thoracic region: Secondary | ICD-10-CM | POA: Diagnosis not present

## 2020-03-02 DIAGNOSIS — M9901 Segmental and somatic dysfunction of cervical region: Secondary | ICD-10-CM | POA: Diagnosis not present

## 2020-03-02 DIAGNOSIS — M9902 Segmental and somatic dysfunction of thoracic region: Secondary | ICD-10-CM | POA: Diagnosis not present

## 2020-03-02 DIAGNOSIS — M9903 Segmental and somatic dysfunction of lumbar region: Secondary | ICD-10-CM | POA: Diagnosis not present

## 2020-03-02 DIAGNOSIS — M9905 Segmental and somatic dysfunction of pelvic region: Secondary | ICD-10-CM | POA: Diagnosis not present

## 2020-03-23 DIAGNOSIS — M9903 Segmental and somatic dysfunction of lumbar region: Secondary | ICD-10-CM | POA: Diagnosis not present

## 2020-03-23 DIAGNOSIS — M9902 Segmental and somatic dysfunction of thoracic region: Secondary | ICD-10-CM | POA: Diagnosis not present

## 2020-03-23 DIAGNOSIS — M9905 Segmental and somatic dysfunction of pelvic region: Secondary | ICD-10-CM | POA: Diagnosis not present

## 2020-03-23 DIAGNOSIS — M9901 Segmental and somatic dysfunction of cervical region: Secondary | ICD-10-CM | POA: Diagnosis not present

## 2020-04-13 DIAGNOSIS — M9902 Segmental and somatic dysfunction of thoracic region: Secondary | ICD-10-CM | POA: Diagnosis not present

## 2020-04-13 DIAGNOSIS — M9905 Segmental and somatic dysfunction of pelvic region: Secondary | ICD-10-CM | POA: Diagnosis not present

## 2020-04-13 DIAGNOSIS — M9901 Segmental and somatic dysfunction of cervical region: Secondary | ICD-10-CM | POA: Diagnosis not present

## 2020-04-13 DIAGNOSIS — M9903 Segmental and somatic dysfunction of lumbar region: Secondary | ICD-10-CM | POA: Diagnosis not present

## 2020-05-11 DIAGNOSIS — M9903 Segmental and somatic dysfunction of lumbar region: Secondary | ICD-10-CM | POA: Diagnosis not present

## 2020-05-11 DIAGNOSIS — M9902 Segmental and somatic dysfunction of thoracic region: Secondary | ICD-10-CM | POA: Diagnosis not present

## 2020-05-11 DIAGNOSIS — M9901 Segmental and somatic dysfunction of cervical region: Secondary | ICD-10-CM | POA: Diagnosis not present

## 2020-05-11 DIAGNOSIS — M9905 Segmental and somatic dysfunction of pelvic region: Secondary | ICD-10-CM | POA: Diagnosis not present

## 2020-05-12 ENCOUNTER — Other Ambulatory Visit: Payer: Self-pay | Admitting: Obstetrics & Gynecology

## 2020-05-12 NOTE — Telephone Encounter (Signed)
Med refill request: Angela Joseph Last AEX: 05/04/19 w/ ML Next AEX: Not scheduled Last MMG (if hormonal med) N/A   Refill sent for Angela Joseph; Dispense #[id number]  Call placed to patient, left detailed message, ok per dpr. Advised refill of OCP sent to pharmacy, will need AEX for additional refills. Return call to [phone] to schedule.   Encounter closed.

## 2020-06-08 DIAGNOSIS — M9901 Segmental and somatic dysfunction of cervical region: Secondary | ICD-10-CM | POA: Diagnosis not present

## 2020-06-08 DIAGNOSIS — M9905 Segmental and somatic dysfunction of pelvic region: Secondary | ICD-10-CM | POA: Diagnosis not present

## 2020-06-08 DIAGNOSIS — M9902 Segmental and somatic dysfunction of thoracic region: Secondary | ICD-10-CM | POA: Diagnosis not present

## 2020-06-08 DIAGNOSIS — M9903 Segmental and somatic dysfunction of lumbar region: Secondary | ICD-10-CM | POA: Diagnosis not present

## 2020-06-20 ENCOUNTER — Encounter (HOSPITAL_COMMUNITY): Payer: Self-pay | Admitting: Emergency Medicine

## 2020-06-20 ENCOUNTER — Other Ambulatory Visit: Payer: Self-pay

## 2020-06-20 ENCOUNTER — Ambulatory Visit (HOSPITAL_COMMUNITY)
Admission: EM | Admit: 2020-06-20 | Discharge: 2020-06-20 | Disposition: A | Discharge status: Home / Self Care | Payer: BC Managed Care – PPO | Attending: Emergency Medicine | Admitting: Emergency Medicine

## 2020-06-20 DIAGNOSIS — S0181XA Laceration without foreign body of other part of head, initial encounter: Secondary | ICD-10-CM

## 2020-06-20 DIAGNOSIS — W540XXA Bitten by dog, initial encounter: Secondary | ICD-10-CM

## 2020-06-20 MED ORDER — DOXYCYCLINE HYCLATE 100 MG PO CAPS: 100.0000 mg | ORAL_CAPSULE | Freq: Two times a day (BID) | ORAL | 0 refills | Status: DC

## 2020-06-20 MED ORDER — METRONIDAZOLE 500 MG PO TABS: 500.0000 mg | ORAL_TABLET | Freq: Three times a day (TID) | ORAL | 0 refills | Status: DC

## 2020-06-20 NOTE — Discharge Instructions (Addendum)
Take doxycyline twice a day for 5 days  Take Flagyl three times a day for 5 days   Please follow up if area continues to increase in size, becomes more tender or painful, becomes warmer, starts to drain fluid  If you begin to have diarrhea, it is due to the antibiotics and will subside after completion, if diarrhea occurs increase fluid intake to maintain hydration

## 2020-06-20 NOTE — ED Provider Notes (Signed)
[facility]    CSN: [id number] Arrival date & time: 06/20/20  8144      History   Chief Complaint Chief Complaint  Patient presents with  . Facial Swelling    HPI Angela Joseph is a 35 y.o. female.   Patient presents for evaluation of dog bite or abrasion on the top of the right eyebrow that occurred 1 week ago.  She was lying on back, her dogs were playing over her, when she sat up she was scratched by either teeth or nails.  Showed picture of initial area where 2 abrasions were present.  Abrasions have now healed however there is now a nodule is tender, warm, pruritic and increasing in size.  Denies fevers or chills.  Dogs are up-to-date on all vaccinations including rabies.  Patient is up-to-date on tetanus vaccination.  Has not attempted treatment over the area.  Past Medical History:  Diagnosis Date  . Allergy   . Migraine     Patient Active Problem List   Diagnosis Date Noted  . Acute appendicitis 12/06/2018    Past Surgical History:  Procedure Laterality Date  . DENTAL SURGERY    . KNEE SURGERY    . LAPAROSCOPIC APPENDECTOMY N/A 12/06/2018   Procedure: APPENDECTOMY LAPAROSCOPIC;  Surgeon: Harriette Bouillon, MD;  Location: [facility];  Service: General;  Laterality: N/A;    OB History    Gravida  0   Para  0   Term  0   Preterm  0   AB  0   Living  0     SAB  0   IAB  0   Ectopic  0   Multiple  0   Live Births  0            Home Medications    Prior to Admission medications   Medication Sig Start Date End Date Taking? Authorizing Provider  doxycycline (VIBRAMYCIN) 100 MG capsule Take 1 capsule (100 mg total) by mouth 2 (two) times daily. 06/20/20  Yes Anysha Frappier R, NP  metroNIDAZOLE (FLAGYL) 500 MG tablet Take 1 tablet (500 mg total) by mouth 3 (three) times daily. 06/20/20  Yes Rever Pichette R, NP  benzonatate (TESSALON) 100 MG capsule Take 1 capsule (100 mg total) by mouth 3 (three) times daily as needed for cough.  09/20/19   Darr, Gerilyn Pilgrim, PA-C  cetirizine (ZYRTEC ALLERGY) 10 MG tablet Take 1 tablet (10 mg total) by mouth daily. 09/20/19   Darr, Gerilyn Pilgrim, PA-C  fluticasone (FLONASE) 50 MCG/ACT nasal spray Place 1 spray into both nostrils daily. 09/20/19   Darr, Gerilyn Pilgrim, PA-C  HAILEY 24 FE 1-20 MG-MCG(24) tablet TAKE 1 TABLET BY MOUTH DAILY. CONTINUOUS USE FOR SEVERE DYSMENORRHEA 05/12/20   [name], MD  ibuprofen (ADVIL) 600 MG tablet Take 1 tablet (600 mg total) by mouth every 6 (six) hours as needed. 12/26/18   McDonald, Mia A, PA-C  sodium chloride (OCEAN) 0.65 % SOLN nasal spray Place 1 spray into both nostrils as needed for congestion. 09/20/19   Darr, Gerilyn Pilgrim, PA-C    Family History Family History  Problem Relation Age of Onset  . Hypertension Father   . Cancer Paternal Grandmother     Social History Social History   Tobacco Use  . Smoking status: Never Smoker  . Smokeless tobacco: Never Used  Vaping Use  . Vaping Use: Never used  Substance Use Topics  . Alcohol use: Yes  . Drug use: No     Allergies  Tamiflu [oseltamivir phosphate], Bee venom, Ceclor [cefaclor], Cleocin [clindamycin hcl], Other, Penicillins, and Zinc   Review of Systems Review of Systems  Constitutional: Negative.   Respiratory: Negative.   Cardiovascular: Negative.   Skin: Positive for wound. Negative for color change, pallor and rash.  Neurological: Negative.      Physical Exam Triage Vital Signs ED Triage Vitals  Enc Vitals Group     BP 06/20/20 0856 131/86     Pulse Rate 06/20/20 0856 82     Resp 06/20/20 0856 16     Temp 06/20/20 0856 98.8 F (37.1 C)     Temp Source 06/20/20 0856 Oral     SpO2 06/20/20 0856 98 %     Weight --      Height --      Head Circumference --      Peak Flow --      Pain Score 06/20/20 0853 0     Pain Loc --      Pain Edu? --      Excl. in GC? --    No data found.  Updated Vital Signs BP 131/86 (BP Location: Left Arm)   Pulse 82   Temp 98.8 F (37.1 C)  (Oral)   Resp 16   LMP  (LMP Unknown)   SpO2 98%   Visual Acuity Right Eye Distance:   Left Eye Distance:   Bilateral Distance:    Right Eye Near:   Left Eye Near:    Bilateral Near:     Physical Exam Constitutional:      Appearance: Normal appearance. She is normal weight.  HENT:     Head: Normocephalic.  Eyes:     Extraocular Movements: Extraocular movements intact.  Pulmonary:     Effort: Pulmonary effort is normal.  Musculoskeletal:        General: Normal range of motion.     Cervical back: Normal range of motion.  Skin:         Comments: Nodule present over right lateral eyebrow, erythematous and tender to palpation   Neurological:     Mental Status: She is alert and oriented to person, place, and time. Mental status is at baseline.  Psychiatric:        Mood and Affect: Mood normal.        Behavior: Behavior normal.        Thought Content: Thought content normal.        Judgment: Judgment normal.      UC Treatments / Results  Labs (all labs ordered are listed, but only abnormal results are displayed) Labs Reviewed - No data to display  EKG   Radiology No results found.  Procedures Procedures (including critical care time)  Medications Ordered in UC Medications - No data to display  Initial Impression / Assessment and Plan / UC Course  I have reviewed the triage vital signs and the nursing notes.  Pertinent labs & imaging results that were available during my care of the patient were reviewed by me and considered in my medical decision making (see chart for details).  Dog bite, initial counter   Due to increasing of size and tenderness patient will be placed on prophylactic antibiotics for possible infection.  1.  Doxycycline 100 mg bid for 5 days 2.  Flagyl 500 mg 3 times daily for 5 days 3.  Strict return precautions given for reevaluation, verbalized understanding Final Clinical Impressions(s) / UC Diagnoses   Final diagnoses:  Dog bite,  initial  encounter     Discharge Instructions     Take doxycyline twice a day for 5 days  Take Flagyl three times a day for 5 days   Please follow up if area continues to increase in size, becomes more tender or painful, becomes warmer, starts to drain fluid  If you begin to have diarrhea, it is due to the antibiotics and will subside after completion, if diarrhea occurs increase fluid intake to maintain hydration    ED Prescriptions    Medication Sig Dispense Auth. Provider   doxycycline (VIBRAMYCIN) 100 MG capsule Take 1 capsule (100 mg total) by mouth 2 (two) times daily. 10 capsule Addiel Mccardle R, NP   metroNIDAZOLE (FLAGYL) 500 MG tablet Take 1 tablet (500 mg total) by mouth 3 (three) times daily. 15 tablet Neema Barreira, Elita Boone, NP     PDMP not reviewed this encounter.   Valinda Hoar, [place] 06/20/20 [phone]

## 2020-06-20 NOTE — ED Triage Notes (Signed)
Pt presents with two knots on right side of forehead and minor abrasions xs 1 week. States was either scratched with dogs nails or teeth. States area is warm and at time itches.

## 2020-07-10 DIAGNOSIS — M9903 Segmental and somatic dysfunction of lumbar region: Secondary | ICD-10-CM | POA: Diagnosis not present

## 2020-07-10 DIAGNOSIS — M9902 Segmental and somatic dysfunction of thoracic region: Secondary | ICD-10-CM | POA: Diagnosis not present

## 2020-07-10 DIAGNOSIS — M9901 Segmental and somatic dysfunction of cervical region: Secondary | ICD-10-CM | POA: Diagnosis not present

## 2020-07-10 DIAGNOSIS — M9905 Segmental and somatic dysfunction of pelvic region: Secondary | ICD-10-CM | POA: Diagnosis not present

## 2020-07-31 DIAGNOSIS — M9902 Segmental and somatic dysfunction of thoracic region: Secondary | ICD-10-CM | POA: Diagnosis not present

## 2020-07-31 DIAGNOSIS — M9903 Segmental and somatic dysfunction of lumbar region: Secondary | ICD-10-CM | POA: Diagnosis not present

## 2020-07-31 DIAGNOSIS — M9905 Segmental and somatic dysfunction of pelvic region: Secondary | ICD-10-CM | POA: Diagnosis not present

## 2020-07-31 DIAGNOSIS — M9901 Segmental and somatic dysfunction of cervical region: Secondary | ICD-10-CM | POA: Diagnosis not present

## 2020-08-04 ENCOUNTER — Other Ambulatory Visit: Payer: Self-pay | Admitting: Obstetrics & Gynecology

## 2020-08-04 NOTE — Telephone Encounter (Signed)
Annual exam scheduled on 08/29/20

## 2020-08-21 ENCOUNTER — Other Ambulatory Visit: Payer: Self-pay | Admitting: Obstetrics & Gynecology

## 2020-08-21 NOTE — Telephone Encounter (Signed)
Annual exam scheduled on 08/29/20 

## 2020-08-29 ENCOUNTER — Ambulatory Visit: Payer: BC Managed Care – PPO | Admitting: Obstetrics & Gynecology

## 2020-08-29 ENCOUNTER — Encounter: Payer: Self-pay | Admitting: Physician Assistant

## 2020-08-29 ENCOUNTER — Telehealth: Payer: BC Managed Care – PPO | Admitting: Physician Assistant

## 2020-08-29 DIAGNOSIS — U071 COVID-19: Secondary | ICD-10-CM

## 2020-08-29 MED ORDER — BENZONATATE 100 MG PO CAPS: 100.0000 mg | ORAL_CAPSULE | Freq: Three times a day (TID) | ORAL | 0 refills | Status: DC | PRN

## 2020-08-29 MED ORDER — MOLNUPIRAVIR EUA 200MG CAPSULE: 4.0000 | ORAL_CAPSULE | Freq: Two times a day (BID) | ORAL | 0 refills | Status: AC

## 2020-08-29 NOTE — Progress Notes (Signed)
Virtual Visit Consent   Angela Joseph, you are scheduled for a virtual visit with a Angela Joseph provider today.     Just as with appointments in the office, your consent must be obtained to participate.  Your consent will be active for this visit and any virtual visit you may have with one of our providers in the next 365 days.     If you have a MyChart account, a copy of this consent can be sent to you electronically.  All virtual visits are billed to your insurance company just like a traditional visit in the office.    As this is a virtual visit, video technology does not allow for your provider to perform a traditional examination.  This may limit your provider's ability to fully assess your condition.  If your provider identifies any concerns that need to be evaluated in person or the need to arrange testing (such as labs, EKG, etc.), we will make arrangements to do so.     Although advances in technology are sophisticated, we cannot ensure that it will always work on either your end or our end.  If the connection with a video visit is poor, the visit may have to be switched to a telephone visit.  With either a video or telephone visit, we are not always able to ensure that we have a secure connection.     I need to obtain your verbal consent now.   Are you willing to proceed with your visit today?    Angela Joseph has provided verbal consent on 08/29/2020 for a virtual visit (video or telephone).   Angela Joseph, [place]   Date: 08/29/2020 5:56 PM   Virtual Visit via Video Note   I, Angela Climes, PA-C, attempted to connect with Angela Joseph; MRN [id number] on 08/29/20 via [facility] to complete a video urgent care visit. The patient was unable to successfully connect to the video platform. As such, the patient was contacted by this provider via phone to complete the encounter.   Location: Patient: Virtual Visit Location Patient: Home Provider: Virtual Visit Location  Provider: Home Office   I discussed the limitations of evaluation and management by telemedicine and the availability of in person appointments. The patient expressed understanding and agreed to proceed.    History of Present Illness: Angela Joseph is a 35 y.o. who identifies as a female who was assigned female at birth, and is being seen today for COVID 40.Symptoms starting this Saturday evening while out of town on vacation. Noted fatigue and URI symptoms. Drove home on Sunday, taking a COVID test Sunday night which came back positive. Is now aware of multiple contacts with COVID. Notes symptoms have continued to worsen since onset. Keeps a low-grade fever (Tmax 101) if not taking Ibuprofen. Is still having chills, headache, voice hoarseness, chest congestion, persistent cough that is productive of clear sputum and sometimes she notes coughing so hard it makes her gag. Endorses some nasal congestion and sinus pressure. No SOB outside of her coughing spells. Has been taking steam showers, using Ibuprofen and Sudafed OTC with only some relief of symptoms.  Patient denies any chronic lung or heart issues. Is a non-smoker. No personal history of cancer. Does have history of obesity.   HPI: HPI  Problems:  Patient Active Problem List   Diagnosis Date Noted   Acute appendicitis 12/06/2018    Allergies:  Allergies  Allergen Reactions   Tamiflu [Oseltamivir Phosphate]  Bee Venom Swelling    Yellow jacket   Ceclor [Cefaclor] Rash and Other (See Comments)    High fever and rash    Cleocin [Clindamycin Hcl] Rash and Other (See Comments)    Fever and rash in childhood   Other Rash and Other (See Comments)    All cilins and mycins causes rash and high fever at childhood    Penicillins Rash and Other (See Comments)    Fever and rash in childhood Did it involve swelling of the face/tongue/throat, SOB, or low BP? Y Did it involve sudden or severe rash/hives, skin peeling, or any reaction on the  inside of your mouth or nose? UNK Did you need to seek medical attention at a hospital or doctor's office? UNK When did it last happen?      more than 10 years If all above answers are "NO", may proceed with cephalosporin use.    Zinc Rash    Zinc oxide   Medications:  Current Outpatient Medications:    benzonatate (TESSALON) 100 MG capsule, Take 1 capsule (100 mg total) by mouth 3 (three) times daily as needed for cough., Disp: 30 capsule, Rfl: 0   molnupiravir EUA 200 mg CAPS, Take 4 capsules (800 mg total) by mouth 2 (two) times daily for 5 days., Disp: 40 capsule, Rfl: 0   pseudoephedrine (SUDAFED 12 HOUR) 120 MG 12 hr tablet, , Disp: , Rfl:    HAILEY 24 FE 1-20 MG-MCG(24) tablet, TAKE 1 TABLET BY MOUTH DAILY. CONTINUOUS USE FOR SEVERE DYSMENORRHEA, Disp: 28 tablet, Rfl: 0   ibuprofen (ADVIL) 600 MG tablet, Take 1 tablet (600 mg total) by mouth every 6 (six) hours as needed., Disp: 30 tablet, Rfl: 0  Observations/Objective: Patient is in no acute distress.  Resting comfortably at home.  No labored breathing.  Speech is clear and coherent with logical content.  Patient is alert and oriented at baseline.   Assessment and Plan: 1. COVID-19 - molnupiravir EUA 200 mg CAPS; Take 4 capsules (800 mg total) by mouth 2 (two) times daily for 5 days.  Dispense: 40 capsule; Refill: 0 - MyChart COVID-19 home monitoring program; Future - benzonatate (TESSALON) 100 MG capsule; Take 1 capsule (100 mg total) by mouth 3 (three) times daily as needed for cough.  Dispense: 30 capsule; Refill: 0 Patient with moderate symptoms.  Does have history of significant obesity.  Discussed she someone where antivirals can be considered but or not a necessity.  Discussed pros and cons of antiviral therapy including potential adverse reactions.  Patient would like to proceed with antiviral therapy.  She is currently only a candidate for molnupiravir.  Rx sent to the pharmacy.  Supportive measures, OTC medications and  vitamin regimen reviewed with patient.  Patient has been enrolled in a MyChart COVID symptom monitoring program.  Anne Shutter reviewed in detail with patient.  Strict ER precautions given with patient voicing understanding and agreement.  Follow Up Instructions: I discussed the assessment and treatment plan with the patient. The patient was provided an opportunity to ask questions and all were answered. The patient agreed with the plan and demonstrated an understanding of the instructions.  A copy of instructions were sent to the patient via MyChart.  The patient was advised to call back or seek an in-person evaluation if the symptoms worsen or if the condition fails to improve as anticipated.  Time:  I spent 18 minutes with the patient via telehealth technology discussing the above problems/concerns.    Sherley Bounds  Hassell Done, PA-C

## 2020-08-29 NOTE — Patient Instructions (Signed)
Angela Joseph, thank you for joining [name], PA-C for today's virtual visit.  While this provider is not your primary care provider (PCP), if your PCP is located in our provider database this encounter information will be shared with them immediately following your visit.  Consent: (Patient) Angela Joseph provided verbal consent for this virtual visit at the beginning of the encounter.  Current Medications:  Current Outpatient Medications:    benzonatate (TESSALON) 100 MG capsule, Take 1 capsule (100 mg total) by mouth 3 (three) times daily as needed for cough., Disp: 21 capsule, Rfl: 0   cetirizine (ZYRTEC ALLERGY) 10 MG tablet, Take 1 tablet (10 mg total) by mouth daily., Disp: 30 tablet, Rfl: 0   doxycycline (VIBRAMYCIN) 100 MG capsule, Take 1 capsule (100 mg total) by mouth 2 (two) times daily., Disp: 10 capsule, Rfl: 0   fluticasone (FLONASE) 50 MCG/ACT nasal spray, Place 1 spray into both nostrils daily., Disp: 15.8 mL, Rfl: 0   HAILEY 24 FE 1-20 MG-MCG(24) tablet, TAKE 1 TABLET BY MOUTH DAILY. CONTINUOUS USE FOR SEVERE DYSMENORRHEA, Disp: 28 tablet, Rfl: 0   ibuprofen (ADVIL) 600 MG tablet, Take 1 tablet (600 mg total) by mouth every 6 (six) hours as needed., Disp: 30 tablet, Rfl: 0   metroNIDAZOLE (FLAGYL) 500 MG tablet, Take 1 tablet (500 mg total) by mouth 3 (three) times daily., Disp: 15 tablet, Rfl: 0   sodium chloride (OCEAN) 0.65 % SOLN nasal spray, Place 1 spray into both nostrils as needed for congestion., Disp: 44 mL, Rfl: 0   Medications ordered in this encounter:  No orders of the defined types were placed in this encounter.    *If you need refills on other medications prior to your next appointment, please contact your pharmacy*  Follow-Up: Call back or seek an in-person evaluation if the symptoms worsen or if the condition fails to improve as anticipated.  Other Instructions Please keep well-hydrated and get plenty of rest. Start a saline nasal  rinse to flush out your nasal passages. You can use plain Mucinex to help thin congestion. If you have a humidifier, running in the bedroom at night. I want you to start OTC vitamin D3 1000 units daily, vitamin C 1000 mg daily, and a zinc supplement. Please take prescribed medications as directed.  You have been enrolled in a MyChart symptom monitoring program. Please answer these questions daily so we can keep track of how you are doing.  You were to quarantine for 5 days from onset of your symptoms.  After day 5, if you have had no fever and you are feeling better, you can end quarantine but need to mask for an additional 5 days. After day 5 if you have a fever or are having significant symptoms, please quarantine for full 10 days.  If you note any worsening of symptoms, any significant shortness of breath or any chest pain, please seek ER evaluation ASAP.  Please do not delay care!    If you have been instructed to have an in-person evaluation today at a local Urgent Care facility, please use the link below. It will take you to a list of all of our available [facility], including address, phone number and hours of operation. Please do not delay care.  [facility]  If you or a family member do not have a primary care provider, use the link below to schedule a visit and establish care. When you choose a   primary care physician or advanced practice provider, you gain a long-term partner in health. Find a Primary Care Provider  Learn more about [facility]'s in-office and virtual care options: [facility]

## 2020-09-14 DIAGNOSIS — M9902 Segmental and somatic dysfunction of thoracic region: Secondary | ICD-10-CM | POA: Diagnosis not present

## 2020-09-14 DIAGNOSIS — M9903 Segmental and somatic dysfunction of lumbar region: Secondary | ICD-10-CM | POA: Diagnosis not present

## 2020-09-14 DIAGNOSIS — M9905 Segmental and somatic dysfunction of pelvic region: Secondary | ICD-10-CM | POA: Diagnosis not present

## 2020-09-14 DIAGNOSIS — M9901 Segmental and somatic dysfunction of cervical region: Secondary | ICD-10-CM | POA: Diagnosis not present

## 2020-09-18 ENCOUNTER — Other Ambulatory Visit: Payer: Self-pay | Admitting: Obstetrics & Gynecology

## 2020-09-18 NOTE — Telephone Encounter (Signed)
Annual exam scheduled on 10/09/20 

## 2020-10-09 ENCOUNTER — Ambulatory Visit (INDEPENDENT_AMBULATORY_CARE_PROVIDER_SITE_OTHER): Payer: BC Managed Care – PPO | Admitting: Obstetrics & Gynecology

## 2020-10-09 ENCOUNTER — Other Ambulatory Visit (HOSPITAL_COMMUNITY)
Admission: RE | Admit: 2020-10-09 | Discharge: 2020-10-09 | Disposition: A | Discharge status: Home / Self Care | Payer: BC Managed Care – PPO | Source: Ambulatory Visit | Attending: Obstetrics & Gynecology | Admitting: Obstetrics & Gynecology

## 2020-10-09 ENCOUNTER — Encounter: Payer: Self-pay | Admitting: Obstetrics & Gynecology

## 2020-10-09 ENCOUNTER — Other Ambulatory Visit: Payer: Self-pay

## 2020-10-09 VITALS — BP 130/84 | HR 90 | Ht 69.0 in | Wt 201.0 lb

## 2020-10-09 DIAGNOSIS — N870 Mild cervical dysplasia: Secondary | ICD-10-CM | POA: Insufficient documentation

## 2020-10-09 DIAGNOSIS — Z3041 Encounter for surveillance of contraceptive pills: Secondary | ICD-10-CM | POA: Diagnosis not present

## 2020-10-09 DIAGNOSIS — Z01419 Encounter for gynecological examination (general) (routine) without abnormal findings: Secondary | ICD-10-CM | POA: Diagnosis not present

## 2020-10-09 MED ORDER — HAILEY 24 FE 1-20 MG-MCG(24) PO TABS: 1.0000 | ORAL_TABLET | Freq: Every day | ORAL | 4 refills | Status: DC

## 2020-10-09 NOTE — Progress Notes (Signed)
Angela Joseph 04-02-1985 [id number]   History:    35 y.o.  G0 Single   RP:  Established patient presenting for annual gyn exam    HPI: Well on [place].  Occasional BTB.  No Pelvic pain.  Currently abstinent.  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  HPV 16-18-45 negative.  Last Pap Neg/HPV HR Negative 04/2019.  Urine/BMs wnl.  Breasts wnl. BMI improved to 29.68.  Physically active, but lower energy x COVID in 08/2020.  Appendectomy 11/2018.    Past medical history,surgical history, family history and social history were all reviewed and documented in the EPIC chart.  Gynecologic History No LMP recorded. (Menstrual status: Oral contraceptives).   Obstetric History OB History  Gravida Para Term Preterm AB Living  0 0 0 0 0 0  SAB IAB Ectopic Multiple Live Births  0 0 0 0 0     ROS: A ROS was performed and pertinent positives and negatives are included in the history.  GENERAL: No fevers or chills. HEENT: No change in vision, no earache, sore throat or sinus congestion. NECK: No pain or stiffness. CARDIOVASCULAR: No chest pain or pressure. No palpitations. PULMONARY: No shortness of breath, cough or wheeze. GASTROINTESTINAL: No abdominal pain, nausea, vomiting or diarrhea, melena or bright red blood per rectum. GENITOURINARY: No urinary frequency, urgency, hesitancy or dysuria. MUSCULOSKELETAL: No joint or muscle pain, no back pain, no recent trauma. DERMATOLOGIC: No rash, no itching, no lesions. ENDOCRINE: No polyuria, polydipsia, no heat or cold intolerance. No recent change in weight. HEMATOLOGICAL: No anemia or easy bruising or bleeding. NEUROLOGIC: No headache, seizures, numbness, tingling or weakness. PSYCHIATRIC: No depression, no loss of interest in normal activity or change in sleep pattern.     Exam:   BP 130/84   Pulse 90   Ht 5\' 9"  (1.753 m)   Wt 201 lb (91.2 kg)   SpO2 99%   BMI 29.68 kg/m   Body mass index is 29.68 kg/m.  General appearance : Well developed well  nourished female. No acute distress HEENT: Eyes: no retinal hemorrhage or exudates,  Neck supple, trachea midline, no carotid bruits, no thyroidmegaly Lungs: Clear to auscultation, no rhonchi or wheezes, or rib retractions  Heart: Regular rate and rhythm, no murmurs or gallops Breast:Examined in sitting and supine position were symmetrical in appearance, no palpable masses or tenderness,  no skin retraction, no nipple inversion, no nipple discharge, no skin discoloration, no axillary or supraclavicular lymphadenopathy Abdomen: no palpable masses or tenderness, no rebound or guarding Extremities: no edema or skin discoloration or tenderness  Pelvic: Vulva: Normal             Vagina: No gross lesions or discharge  Cervix: No gross lesions or discharge.  Pap reflex done.  Uterus  AV, normal size, shape and consistency, non-tender and mobile  Adnexa  Without masses or tenderness  Anus: Normal   Assessment/Plan:  35 y.o. female for annual exam   1. Encounter for routine gynecological examination with Papanicolaou smear of cervix Normal gynecologic exam.  Pap reflex done.  Breast exam normal.  Body mass index 29.68.  Recommend a lower calorie/carb diet.  Aerobic activities 5 times a week and light weightlifting every 2 days. - Cytology - PAP( [place])  2. Dysplasia of cervix, low grade (CIN 1) - Cytology - PAP( [place])  3. Encounter for surveillance of contraceptive pills Well on Hailey 24 FE 1/20.  Using continuously for dysmenorrhea and migraines.  Will  let a menstrual period come for 1 or 2 cycles and then observe to see if breakthrough bleeding is improved.  If not, will call back to change birth control pills.  For now no contraindication to continue on the same pill.  Prescription sent to pharmacy.  Other orders - Norethindrone Acetate-Ethinyl Estrad-FE (HAILEY 24 FE) 1-20 MG-MCG(24) tablet; Take 1 tablet by mouth daily. Continuous use for Dysmeno/Migraines   [name]  MD, 10:13 AM 10/09/2020

## 2020-10-12 DIAGNOSIS — M9901 Segmental and somatic dysfunction of cervical region: Secondary | ICD-10-CM | POA: Diagnosis not present

## 2020-10-12 DIAGNOSIS — M9903 Segmental and somatic dysfunction of lumbar region: Secondary | ICD-10-CM | POA: Diagnosis not present

## 2020-10-12 DIAGNOSIS — M9902 Segmental and somatic dysfunction of thoracic region: Secondary | ICD-10-CM | POA: Diagnosis not present

## 2020-10-12 DIAGNOSIS — M9905 Segmental and somatic dysfunction of pelvic region: Secondary | ICD-10-CM | POA: Diagnosis not present

## 2020-10-12 LAB — CYTOLOGY - PAP
Comment: NEGATIVE
Diagnosis: UNDETERMINED — AB
High risk HPV: NEGATIVE

## 2020-10-17 ENCOUNTER — Encounter: Payer: Self-pay | Admitting: *Deleted

## 2020-12-04 DIAGNOSIS — M9903 Segmental and somatic dysfunction of lumbar region: Secondary | ICD-10-CM | POA: Diagnosis not present

## 2020-12-04 DIAGNOSIS — M9902 Segmental and somatic dysfunction of thoracic region: Secondary | ICD-10-CM | POA: Diagnosis not present

## 2020-12-04 DIAGNOSIS — M9905 Segmental and somatic dysfunction of pelvic region: Secondary | ICD-10-CM | POA: Diagnosis not present

## 2020-12-04 DIAGNOSIS — M9901 Segmental and somatic dysfunction of cervical region: Secondary | ICD-10-CM | POA: Diagnosis not present

## 2021-01-08 DIAGNOSIS — M9903 Segmental and somatic dysfunction of lumbar region: Secondary | ICD-10-CM | POA: Diagnosis not present

## 2021-01-08 DIAGNOSIS — M9902 Segmental and somatic dysfunction of thoracic region: Secondary | ICD-10-CM | POA: Diagnosis not present

## 2021-01-08 DIAGNOSIS — M9905 Segmental and somatic dysfunction of pelvic region: Secondary | ICD-10-CM | POA: Diagnosis not present

## 2021-01-08 DIAGNOSIS — M9901 Segmental and somatic dysfunction of cervical region: Secondary | ICD-10-CM | POA: Diagnosis not present

## 2021-02-02 DIAGNOSIS — M9905 Segmental and somatic dysfunction of pelvic region: Secondary | ICD-10-CM | POA: Diagnosis not present

## 2021-02-02 DIAGNOSIS — M9902 Segmental and somatic dysfunction of thoracic region: Secondary | ICD-10-CM | POA: Diagnosis not present

## 2021-02-02 DIAGNOSIS — M9903 Segmental and somatic dysfunction of lumbar region: Secondary | ICD-10-CM | POA: Diagnosis not present

## 2021-02-02 DIAGNOSIS — M9901 Segmental and somatic dysfunction of cervical region: Secondary | ICD-10-CM | POA: Diagnosis not present

## 2021-02-15 ENCOUNTER — Telehealth: Payer: BC Managed Care – PPO | Admitting: Physician Assistant

## 2021-02-15 DIAGNOSIS — U071 COVID-19: Secondary | ICD-10-CM

## 2021-02-15 MED ORDER — PSEUDOEPH-BROMPHEN-DM 30-2-10 MG/5ML PO SYRP: 5.0000 mL | ORAL_SOLUTION | Freq: Four times a day (QID) | ORAL | 0 refills | Status: DC | PRN

## 2021-02-15 MED ORDER — ONDANSETRON HCL 4 MG PO TABS: 4.0000 mg | ORAL_TABLET | Freq: Three times a day (TID) | ORAL | 0 refills | Status: DC | PRN

## 2021-02-15 MED ORDER — DEXAMETHASONE 4 MG PO TABS: 4.0000 mg | ORAL_TABLET | Freq: Two times a day (BID) | ORAL | 0 refills | Status: DC

## 2021-02-15 MED ORDER — MOLNUPIRAVIR EUA 200MG CAPSULE: 4.0000 | ORAL_CAPSULE | Freq: Two times a day (BID) | ORAL | 0 refills | Status: AC

## 2021-02-15 NOTE — Progress Notes (Signed)
Virtual Visit Consent   Angela Joseph, you are scheduled for a virtual visit with a [facility] provider today.     Just as with appointments in the office, your consent must be obtained to participate.  Your consent will be active for this visit and any virtual visit you may have with one of our providers in the next 365 days.     If you have a MyChart account, a copy of this consent can be sent to you electronically.  All virtual visits are billed to your insurance company just like a traditional visit in the office.    As this is a virtual visit, video technology does not allow for your provider to perform a traditional examination.  This may limit your provider's ability to fully assess your condition.  If your provider identifies any concerns that need to be evaluated in person or the need to arrange testing (such as labs, EKG, etc.), we will make arrangements to do so.     Although advances in technology are sophisticated, we cannot ensure that it will always work on either your end or our end.  If the connection with a video visit is poor, the visit may have to be switched to a telephone visit.  With either a video or telephone visit, we are not always able to ensure that we have a secure connection.     I need to obtain your verbal consent now.   Are you willing to proceed with your visit today?    Angela Joseph has provided verbal consent on 02/15/2021 for a virtual visit (video or telephone).   Angela Loveless, PA-C   Date: 02/15/2021 3:43 PM   Virtual Visit via Video Note   IMargaretann Joseph, connected with  Angela Joseph  ([id number], 1986/02/17) on 02/15/21 at  3:15 PM EST by a video-enabled telemedicine application and verified that I am speaking with the correct person using two identifiers.  Location: Patient: Virtual Visit Location Patient: Home Provider: Virtual Visit Location Provider: Home Office   I discussed the limitations of evaluation and  management by telemedicine and the availability of in person appointments. The patient expressed understanding and agreed to proceed.    History of Present Illness: Angela Joseph is a 35 y.o. who identifies as a female who was assigned female at birth, and is being seen today for Covid 27.  HPI: URI  This is a new problem. The current episode started in the past 7 days (tested positive yesterday; symptoms started Monday). The problem has been gradually worsening. The maximum temperature recorded prior to her arrival was 101 - 101.9 F (99 up to 101). The fever has been present for 1 to 2 days. Associated symptoms include congestion, coughing (bronchial; deep; mostly dry except productive in morning), headaches, a plugged ear sensation, rhinorrhea, sinus pain and a sore throat. Pertinent negatives include no diarrhea, ear pain, nausea or vomiting. Associated symptoms comments: Hoarse voice, fatigue, eye pain, upper jaw pain, post nasal drainage, chills, brain fog, decreased appetite. Treatments tried: Mucinex, ibuprofen, dayquil/nyquil. The treatment provided no relief.     Problems:  Patient Active Problem List   Diagnosis Date Noted   Acute appendicitis 12/06/2018    Allergies:  Allergies  Allergen Reactions   Tamiflu [Oseltamivir Phosphate]    Bee Venom Swelling    Yellow jacket   Ceclor [Cefaclor] Rash and Other (See Comments)    High fever and rash    Cleocin [  Clindamycin Hcl] Rash and Other (See Comments)    Fever and rash in childhood   Other Rash and Other (See Comments)    All cilins and mycins causes rash and high fever at childhood    Penicillins Rash and Other (See Comments)    Fever and rash in childhood Did it involve swelling of the face/tongue/throat, SOB, or low BP? Y Did it involve sudden or severe rash/hives, skin peeling, or any reaction on the inside of your mouth or nose? UNK Did you need to seek medical attention at a hospital or doctor's office? UNK When did it  last happen?      more than 10 years If all above answers are NO, may proceed with cephalosporin use.    Zinc Rash    Zinc oxide   Medications:  Current Outpatient Medications:    brompheniramine-pseudoephedrine-DM 30-2-10 MG/5ML syrup, Take 5 mLs by mouth 4 (four) times daily as needed., Disp: 120 mL, Rfl: 0   dexamethasone (DECADRON) 4 MG tablet, Take 1 tablet (4 mg total) by mouth 2 (two) times daily., Disp: 14 tablet, Rfl: 0   molnupiravir EUA (LAGEVRIO) 200 mg CAPS capsule, Take 4 capsules (800 mg total) by mouth 2 (two) times daily for 5 days., Disp: 40 capsule, Rfl: 0   ondansetron (ZOFRAN) 4 MG tablet, Take 1 tablet (4 mg total) by mouth every 8 (eight) hours as needed for nausea or vomiting., Disp: 20 tablet, Rfl: 0   ibuprofen (ADVIL) 600 MG tablet, Take 1 tablet (600 mg total) by mouth every 6 (six) hours as needed., Disp: 30 tablet, Rfl: 0   Norethindrone Acetate-Ethinyl Estrad-FE (HAILEY 24 FE) 1-20 MG-MCG(24) tablet, Take 1 tablet by mouth daily. Continuous use for Dysmeno/Migraines, Disp: 84 tablet, Rfl: 4   pseudoephedrine (SUDAFED) 120 MG 12 hr tablet, , Disp: , Rfl:   Observations/Objective: Patient is well-developed, well-nourished in no acute distress.  Resting comfortably at home.  Head is normocephalic, atraumatic.  No labored breathing.  Speech is clear and coherent with logical content.  Patient is alert and oriented at baseline.  Voice is hoarse Bronchial deep cough heard multiple times  Assessment and Plan: 1. COVID-19 - molnupiravir EUA (LAGEVRIO) 200 mg CAPS capsule; Take 4 capsules (800 mg total) by mouth 2 (two) times daily for 5 days.  Dispense: 40 capsule; Refill: 0 - ondansetron (ZOFRAN) 4 MG tablet; Take 1 tablet (4 mg total) by mouth every 8 (eight) hours as needed for nausea or vomiting.  Dispense: 20 tablet; Refill: 0 - dexamethasone (DECADRON) 4 MG tablet; Take 1 tablet (4 mg total) by mouth 2 (two) times daily.  Dispense: 14 tablet; Refill: 0 -  brompheniramine-pseudoephedrine-DM 30-2-10 MG/5ML syrup; Take 5 mLs by mouth 4 (four) times daily as needed.  Dispense: 120 mL; Refill: 0  - Continue OTC symptomatic management of choice - Will send OTC vitamins and supplement information through AVS - Molnupiravir, Zofran, Bromfed DM, and Dexamethazone prescribed - Patient enrolled in MyChart symptom monitoring - Push fluids - Rest as needed - Discussed return precautions and when to seek in-person evaluation, sent via AVS as well   Follow Up Instructions: I discussed the assessment and treatment plan with the patient. The patient was provided an opportunity to ask questions and all were answered. The patient agreed with the plan and demonstrated an understanding of the instructions.  A copy of instructions were sent to the patient via MyChart unless otherwise noted below.    The patient was advised to call  back or seek an in-person evaluation if the symptoms worsen or if the condition fails to improve as anticipated.  Time:  I spent 15 minutes with the patient via telehealth technology discussing the above problems/concerns.    Mar Daring, PA-C

## 2021-02-15 NOTE — Patient Instructions (Signed)
Angela Joseph, thank you for joining Mar Daring, PA-C for today's virtual visit.  While this provider is not your primary care provider (PCP), if your PCP is located in our provider database this encounter information will be shared with them immediately following your visit.  Consent: (Patient) Angela Joseph provided verbal consent for this virtual visit at the beginning of the encounter.  Current Medications:  Current Outpatient Medications:    brompheniramine-pseudoephedrine-DM 30-2-10 MG/5ML syrup, Take 5 mLs by mouth 4 (four) times daily as needed., Disp: 120 mL, Rfl: 0   dexamethasone (DECADRON) 4 MG tablet, Take 1 tablet (4 mg total) by mouth 2 (two) times daily., Disp: 14 tablet, Rfl: 0   molnupiravir EUA (LAGEVRIO) 200 mg CAPS capsule, Take 4 capsules (800 mg total) by mouth 2 (two) times daily for 5 days., Disp: 40 capsule, Rfl: 0   ondansetron (ZOFRAN) 4 MG tablet, Take 1 tablet (4 mg total) by mouth every 8 (eight) hours as needed for nausea or vomiting., Disp: 20 tablet, Rfl: 0   ibuprofen (ADVIL) 600 MG tablet, Take 1 tablet (600 mg total) by mouth every 6 (six) hours as needed., Disp: 30 tablet, Rfl: 0   Norethindrone Acetate-Ethinyl Estrad-FE (HAILEY 24 FE) 1-20 MG-MCG(24) tablet, Take 1 tablet by mouth daily. Continuous use for Dysmeno/Migraines, Disp: 84 tablet, Rfl: 4   pseudoephedrine (SUDAFED) 120 MG 12 hr tablet, , Disp: , Rfl:    Medications ordered in this encounter:  Meds ordered this encounter  Medications   molnupiravir EUA (LAGEVRIO) 200 mg CAPS capsule    Sig: Take 4 capsules (800 mg total) by mouth 2 (two) times daily for 5 days.    Dispense:  40 capsule    Refill:  0    Order Specific Question:   Supervising Provider    Answer:   MILLER, BRIAN [[id number]]   ondansetron (ZOFRAN) 4 MG tablet    Sig: Take 1 tablet (4 mg total) by mouth every 8 (eight) hours as needed for nausea or vomiting.    Dispense:  20 tablet    Refill:  0    Order Specific  Question:   Supervising Provider    Answer:   MILLER, BRIAN [[id number]]   dexamethasone (DECADRON) 4 MG tablet    Sig: Take 1 tablet (4 mg total) by mouth 2 (two) times daily.    Dispense:  14 tablet    Refill:  0    Order Specific Question:   Supervising Provider    Answer:   Sabra Heck, BRIAN [id number]   brompheniramine-pseudoephedrine-DM 30-2-10 MG/5ML syrup    Sig: Take 5 mLs by mouth 4 (four) times daily as needed.    Dispense:  120 mL    Refill:  0    Order Specific Question:   Supervising Provider    Answer:   Sabra Heck, BRIAN [[id number]]     *If you need refills on other medications prior to your next appointment, please contact your pharmacy*  Follow-Up: Call back or seek an in-person evaluation if the symptoms worsen or if the condition fails to improve as anticipated.  Other Instructions COVID-19: Quarantine and Isolation Quarantine If you were exposed Quarantine and stay away from others when you have been in close contact with someone who has COVID-19. Isolate If you are sick or test positive Isolate when you are sick or when you have COVID-19, even if you don't have symptoms. When to stay home Calculating quarantine The date of your exposure is considered day 0.  Day 1 is the first full day after your last contact with a person who has had COVID-19. Stay home and away from other people for at least 5 days. Learn why CDC updated guidance for the general public. IF YOU were exposed to COVID-19 and are NOT  up to dateIF YOU were exposed to COVID-19 and are NOT on COVID-19 vaccinations Quarantine for at least 5 days Stay home Stay home and quarantine for at least 5 full days. Wear a well-fitting mask if you must be around others in your home. Do not travel. Get tested Even if you don't develop symptoms, get tested at least 5 days after you last had close contact with someone with COVID-19. After quarantine Watch for symptoms Watch for symptoms until 10 days after you last had close  contact with someone with COVID-19. Avoid travel It is best to avoid travel until a full 10 days after you last had close contact with someone with COVID-19. If you develop symptoms Isolate immediately and get tested. Continue to stay home until you know the results. Wear a well-fitting mask around others. Take precautions until day 10 Wear a well-fitting mask Wear a well-fitting mask for 10 full days any time you are around others inside your home or in public. Do not go to places where you are unable to wear a well-fitting mask. If you must travel during days 6-10, take precautions. Avoid being around people who are more likely to get very sick from COVID-19. IF YOU were exposed to COVID-19 and are  up to dateIF YOU were exposed to COVID-19 and are on COVID-19 vaccinations No quarantine You do not need to stay home unless you develop symptoms. Get tested Even if you don't develop symptoms, get tested at least 5 days after you last had close contact with someone with COVID-19. Watch for symptoms Watch for symptoms until 10 days after you last had close contact with someone with COVID-19. If you develop symptoms Isolate immediately and get tested. Continue to stay home until you know the results. Wear a well-fitting mask around others. Take precautions until day 10 Wear a well-fitting mask Wear a well-fitting mask for 10 full days any time you are around others inside your home or in public. Do not go to places where you are unable to wear a well-fitting mask. Take precautions if traveling Avoid being around people who are more likely to get very sick from COVID-19. IF YOU were exposed to COVID-19 and had confirmed COVID-19 within the past 90 days (you tested positive using a viral test) No quarantine You do not need to stay home unless you develop symptoms. Watch for symptoms Watch for symptoms until 10 days after you last had close contact with someone with COVID-19. If you develop  symptoms Isolate immediately and get tested. Continue to stay home until you know the results. Wear a well-fitting mask around others. Take precautions until day 10 Wear a well-fitting mask Wear a well-fitting mask for 10 full days any time you are around others inside your home or in public. Do not go to places where you are unable to wear a well-fitting mask. Take precautions if traveling Avoid being around people who are more likely to get very sick from COVID-19. Calculating isolation Day 0 is your first day of symptoms or a positive viral test. Day 1 is the first full day after your symptoms developed or your test specimen was collected. If you have COVID-19 or have symptoms, isolate for  at least 5 days. IF YOU tested positive for COVID-19 or have symptoms, regardless of vaccination status Stay home for at least 5 days Stay home for 5 days and isolate from others in your home. Wear a well-fitting mask if you must be around others in your home. Do not travel. Ending isolation if you had symptoms End isolation after 5 full days if you are fever-free for 24 hours (without the use of fever-reducing medication) and your symptoms are improving. Ending isolation if you did NOT have symptoms End isolation after at least 5 full days after your positive test. If you got very sick from COVID-19 or have a weakened immune system You should isolate for at least 10 days. Consult your doctor before ending isolation. Take precautions until day 10 Wear a well-fitting mask Wear a well-fitting mask for 10 full days any time you are around others inside your home or in public. Do not go to places where you are unable to wear a well-fitting mask. Do not travel Do not travel until a full 10 days after your symptoms started or the date your positive test was taken if you had no symptoms. Avoid being around people who are more likely to get very sick from COVID-19. Definitions Exposure Contact with someone  infected with SARS-CoV-2, the virus that causes COVID-19, in a way that increases the likelihood of getting infected with the virus. Close contact A close contact is someone who was less than 6 feet away from an infected person (laboratory-confirmed or a clinical diagnosis) for a cumulative total of 15 minutes or more over a 24-hour period. For example, three individual 5-minute exposures for a total of 15 minutes. People who are exposed to someone with COVID-19 after they completed at least 5 days of isolation are not considered close contacts. Rachel Bo is a strategy used to prevent transmission of COVID-19 by keeping people who have been in close contact with someone with COVID-19 apart from others. Who does not need to quarantine? If you had close contact with someone with COVID-19 and you are in one of the following groups, you do not need to quarantine. You are up to date with your COVID-19 vaccines. You had confirmed COVID-19 within the last 90 days (meaning you tested positive using a viral test). If you are up to date with COVID-19 vaccines, you should wear a well-fitting mask around others for 10 days from the date of your last close contact with someone with COVID-19 (the date of last close contact is considered day 0). Get tested at least 5 days after you last had close contact with someone with COVID-19. If you test positive or develop COVID-19 symptoms, isolate from other people and follow recommendations in the Isolation section below. If you tested positive for COVID-19 with a viral test within the previous 90 days and subsequently recovered and remain without COVID-19 symptoms, you do not need to quarantine or get tested after close contact. You should wear a well-fitting mask around others for 10 days from the date of your last close contact with someone with COVID-19 (the date of last close contact is considered day 0). If you have COVID-19 symptoms, get tested and isolate  from other people and follow recommendations in the Isolation section below. Who should quarantine? If you come into close contact with someone with COVID-19, you should quarantine if you are not up to date on COVID-19 vaccines. This includes people who are not vaccinated. What to do for quarantine Stay  home and away from other people for at least 5 days (day 0 through day 5) after your last contact with a person who has COVID-19. The date of your exposure is considered day 0. Wear a well-fitting mask when around others at home, if possible. For 10 days after your last close contact with someone with COVID-19, watch for fever (100.3F or greater), cough, shortness of breath, or other COVID-19 symptoms. If you develop symptoms, get tested immediately and isolate until you receive your test results. If you test positive, follow isolation recommendations. If you do not develop symptoms, get tested at least 5 days after you last had close contact with someone with COVID-19. If you test negative, you can leave your home, but continue to wear a well-fitting mask when around others at home and in public until 10 days after your last close contact with someone with COVID-19. If you test positive, you should isolate for at least 5 days from the date of your positive test (if you do not have symptoms). If you do develop COVID-19 symptoms, isolate for at least 5 days from the date your symptoms began (the date the symptoms started is day 0). Follow recommendations in the isolation section below. If you are unable to get a test 5 days after last close contact with someone with COVID-19, you can leave your home after day 5 if you have been without COVID-19 symptoms throughout the 5-day period. Wear a well-fitting mask for 10 days after your date of last close contact when around others at home and in public. Avoid people who are have weakened immune systems or are more likely to get very sick from COVID-19, and  nursing homes and other high-risk settings, until after at least 10 days. If possible, stay away from people you live with, especially people who are at higher risk for getting very sick from COVID-19, as well as others outside your home throughout the full 10 days after your last close contact with someone with COVID-19. If you are unable to quarantine, you should wear a well-fitting mask for 10 days when around others at home and in public. If you are unable to wear a mask when around others, you should continue to quarantine for 10 days. Avoid people who have weakened immune systems or are more likely to get very sick from COVID-19, and nursing homes and other high-risk settings, until after at least 10 days. See additional information about travel. Do not go to places where you are unable to wear a mask, such as restaurants and some gyms, and avoid eating around others at home and at work until after 10 days after your last close contact with someone with COVID-19. After quarantine Watch for symptoms until 10 days after your last close contact with someone with COVID-19. If you have symptoms, isolate immediately and get tested. Quarantine in high-risk congregate settings In certain congregate settings that have high risk of secondary transmission (such as Systems analyst and detention facilities, homeless shelters, or cruise ships), CDC recommends a 10-day quarantine for residents, regardless of vaccination and booster status. During periods of critical staffing shortages, facilities may consider shortening the quarantine period for staff to ensure continuity of operations. Decisions to shorten quarantine in these settings should be made in consultation with state, local, tribal, or territorial health departments and should take into consideration the context and characteristics of the facility. CDC's setting-specific guidance provides additional recommendations for these settings. Isolation Isolation  is used to separate people with confirmed  or suspected COVID-19 from those without COVID-19. People who are in isolation should stay home until it's safe for them to be around others. At home, anyone sick or infected should separate from others, or wear a well-fitting mask when they need to be around others. People in isolation should stay in a specific "sick room" or area and use a separate bathroom if available. Everyone who has presumed or confirmed COVID-19 should stay home and isolate from other people for at least 5 full days (day 0 is the first day of symptoms or the date of the day of the positive viral test for asymptomatic persons). They should wear a mask when around others at home and in public for an additional 5 days. People who are confirmed to have COVID-19 or are showing symptoms of COVID-19 need to isolate regardless of their vaccination status. This includes: People who have a positive viral test for COVID-19, regardless of whether or not they have symptoms. People with symptoms of COVID-19, including people who are awaiting test results or have not been tested. People with symptoms should isolate even if they do not know if they have been in close contact with someone with COVID-19. What to do for isolation Monitor your symptoms. If you have an emergency warning sign (including trouble breathing), seek emergency medical care immediately. Stay in a separate room from other household members, if possible. Use a separate bathroom, if possible. Take steps to improve ventilation at home, if possible. Avoid contact with other members of the household and pets. Don't share personal household items, like cups, towels, and utensils. Wear a well-fitting mask when you need to be around other people. Learn more about what to do if you are sick and how to notify your contacts. Ending isolation for people who had COVID-19 and had symptoms If you had COVID-19 and had symptoms, isolate for at least  5 days. To calculate your 5-day isolation period, day 0 is your first day of symptoms. Day 1 is the first full day after your symptoms developed. You can leave isolation after 5 full days. You can end isolation after 5 full days if you are fever-free for 24 hours without the use of fever-reducing medication and your other symptoms have improved (Loss of taste and smell may persist for weeks or months after recovery and need not delay the end of isolation). You should continue to wear a well-fitting mask around others at home and in public for 5 additional days (day 6 through day 10) after the end of your 5-day isolation period. If you are unable to wear a mask when around others, you should continue to isolate for a full 10 days. Avoid people who have weakened immune systems or are more likely to get very sick from COVID-19, and nursing homes and other high-risk settings, until after at least 10 days. If you continue to have fever or your other symptoms have not improved after 5 days of isolation, you should wait to end your isolation until you are fever-free for 24 hours without the use of fever-reducing medication and your other symptoms have improved. Continue to wear a well-fitting mask through day 10. Contact your healthcare provider if you have questions. See additional information about travel. Do not go to places where you are unable to wear a mask, such as restaurants and some gyms, and avoid eating around others at home and at work until a full 10 days after your first day of symptoms. If an individual has  access to a test and wants to test, the best approach is to use an antigen test1 towards the end of the 5-day isolation period. Collect the test sample only if you are fever-free for 24 hours without the use of fever-reducing medication and your other symptoms have improved (loss of taste and smell may persist for weeks or months after recovery and need not delay the end of isolation). If your test  result is positive, you should continue to isolate until day 10. If your test result is negative, you can end isolation, but continue to wear a well-fitting mask around others at home and in public until day 10. Follow additional recommendations for masking and avoiding travel as described above. 1As noted in the labeling for authorized over-the counter antigen tests: Negative results should be treated as presumptive. Negative results do not rule out SARS-CoV-2 infection and should not be used as the sole basis for treatment or patient management decisions, including infection control decisions. To improve results, antigen tests should be used twice over a three-day period with at least 24 hours and no more than 48 hours between tests. Note that these recommendations on ending isolation do not apply to people who are moderately ill or very sick from COVID-19 or have weakened immune systems. See section below for recommendations for when to end isolation for these groups. Ending isolation for people who tested positive for COVID-19 but had no symptoms If you test positive for COVID-19 and never develop symptoms, isolate for at least 5 days. Day 0 is the day of your positive viral test (based on the date you were tested) and day 1 is the first full day after the specimen was collected for your positive test. You can leave isolation after 5 full days. If you continue to have no symptoms, you can end isolation after at least 5 days. You should continue to wear a well-fitting mask around others at home and in public until day 10 (day 6 through day 10). If you are unable to wear a mask when around others, you should continue to isolate for 10 days. Avoid people who have weakened immune systems or are more likely to get very sick from COVID-19, and nursing homes and other high-risk settings, until after at least 10 days. If you develop symptoms after testing positive, your 5-day isolation period should start over.  Day 0 is your first day of symptoms. Follow the recommendations above for ending isolation for people who had COVID-19 and had symptoms. See additional information about travel. Do not go to places where you are unable to wear a mask, such as restaurants and some gyms, and avoid eating around others at home and at work until 10 days after the day of your positive test. If an individual has access to a test and wants to test, the best approach is to use an antigen test1 towards the end of the 5-day isolation period. If your test result is positive, you should continue to isolate until day 10. If your test result is positive, you can also choose to test daily and if your test result is negative, you can end isolation, but continue to wear a well-fitting mask around others at home and in public until day 10. Follow additional recommendations for masking and avoiding travel as described above. 1As noted in the labeling for authorized over-the counter antigen tests: Negative results should be treated as presumptive. Negative results do not rule out SARS-CoV-2 infection and should not be used  as the sole basis for treatment or patient management decisions, including infection control decisions. To improve results, antigen tests should be used twice over a three-day period with at least 24 hours and no more than 48 hours between tests. Ending isolation for people who were moderately or very sick from COVID-19 or have a weakened immune system People who are moderately ill from COVID-19 (experiencing symptoms that affect the lungs like shortness of breath or difficulty breathing) should isolate for 10 days and follow all other isolation precautions. To calculate your 10-day isolation period, day 0 is your first day of symptoms. Day 1 is the first full day after your symptoms developed. If you are unsure if your symptoms are moderate, talk to a healthcare provider for further guidance. People who are very sick from  COVID-19 (this means people who were hospitalized or required intensive care or ventilation support) and people who have weakened immune systems might need to isolate at home longer. They may also require testing with a viral test to determine when they can be around others. CDC recommends an isolation period of at least 10 and up to 20 days for people who were very sick from COVID-19 and for people with weakened immune systems. Consult with your healthcare provider about when you can resume being around other people. If you are unsure if your symptoms are severe or if you have a weakened immune system, talk to a healthcare provider for further guidance. People who have a weakened immune system should talk to their healthcare provider about the potential for reduced immune responses to COVID-19 vaccines and the need to continue to follow current prevention measures (including wearing a well-fitting mask and avoiding crowds and poorly ventilated indoor spaces) to protect themselves against COVID-19 until advised otherwise by their healthcare provider. Close contacts of immunocompromised people--including household members--should also be encouraged to receive all recommended COVID-19 vaccine doses to help protect these people. Isolation in high-risk congregate settings In certain high-risk congregate settings that have high risk of secondary transmission and where it is not feasible to cohort people (such as Systems analyst and detention facilities, homeless shelters, and cruise ships), CDC recommends a 10-day isolation period for residents. During periods of critical staffing shortages, facilities may consider shortening the isolation period for staff to ensure continuity of operations. Decisions to shorten isolation in these settings should be made in consultation with state, local, tribal, or territorial health departments and should take into consideration the context and characteristics of the facility. CDC's  setting-specific guidance provides additional recommendations for these settings. This CDC guidance is meant to supplement--not replace--any federal, state, local, territorial, or tribal health and safety laws, rules, and regulations. Recommendations for specific settings These recommendations do not apply to healthcare professionals. For guidance specific to these settings, see Healthcare professionals: Interim Guidance for Optician, dispensing with SARS-CoV-2 Infection or Exposure to SARS-CoV-2 Patients, residents, and visitors to healthcare settings: Interim Infection Prevention and Control Recommendations for Healthcare Personnel During the [place] 2019 (COVID-19) Pandemic Additional setting-specific guidance and recommendations are available. These recommendations on quarantine and isolation do apply to [facility] settings. Additional guidance is available here: Overview of COVID-19 Quarantine for K-12 Schools Travelers: Travel information and recommendations Congregate facilities and other settings: [employer] for community, work, and school settings Ongoing COVID-19 exposure FAQs I live with someone with COVID-19, but I cannot be separated from them. How do we manage quarantine in this situation? It is very important for people with COVID-19 to remain  apart from other people, if possible, even if they are living together. If separation of the person with COVID-19 from others that they live with is not possible, the other people that they live with will have ongoing exposure, meaning they will be repeatedly exposed until that person is no longer able to spread the virus to other people. In this situation, there are precautions you can take to limit the spread of COVID-19: The person with COVID-19 and everyone they live with should wear a well-fitting mask inside the home. If possible, one person should care for the person with COVID-19 to limit the number of people who  are in close contact with the infected person. Take steps to protect yourself and others to reduce transmission in the home: Quarantine if you are not up to date with your COVID-19 vaccines. Isolate if you are sick or tested positive for COVID-19, even if you don't have symptoms. Learn more about the public health recommendations for testing, mask use and quarantine of close contacts, like yourself, who have ongoing exposure. These recommendations differ depending on your vaccination status. What should I do if I have ongoing exposure to COVID-19 from someone I live with? Recommendations for this situation depend on your vaccination status: If you are not up to date on COVID-19 vaccines and have ongoing exposure to COVID-19, you should: Begin quarantine immediately and continue to quarantine throughout the isolation period of the person with COVID-19. Continue to quarantine for an additional 5 days starting the day after the end of isolation for the person with COVID-19. Get tested at least 5 days after the end of isolation of the infected person that lives with them. If you test negative, you can leave the home but should continue to wear a well-fitting mask when around others at home and in public until 10 days after the end of isolation for the person with COVID-19. Isolate immediately if you develop symptoms of COVID-19 or test positive. If you are up to date with COVID-19 vaccines and have ongoing exposure to COVID-19, you should: Get tested at least 5 days after your first exposure. A person with COVID-19 is considered infectious starting 2 days before they develop symptoms, or 2 days before the date of their positive test if they do not have symptoms. Get tested again at least 5 days after the end of isolation for the person with COVID-19. Wear a well-fitting mask when you are around the person with COVID-19, and do this throughout their isolation period. Wear a well-fitting mask around  others for 10 days after the infected person's isolation period ends. Isolate immediately if you develop symptoms of COVID-19 or test positive. What should I do if multiple people I live with test positive for COVID-19 at different times? Recommendations for this situation depend on your vaccination status: If you are not up to date with your COVID-19 vaccines, you should: Quarantine throughout the isolation period of any infected person that you live with. Continue to quarantine until 5 days after the end of isolation date for the most recently infected person that lives with you. For example, if the last day of isolation of the person most recently infected with COVID-19 was June 30, the new 5-day quarantine period starts on July 1. Get tested at least 5 days after the end of isolation for the most recently infected person that lives with you. Wear a well-fitting mask when you are around any person with COVID-19 while that person is in isolation. Wear  a well-fitting mask when you are around other people until 10 days after your last close contact. Isolate immediately if you develop symptoms of COVID-19 or test positive. If you are up to date with your COVID-19 vaccines, you should: Get tested at least 5 days after your first exposure. A person with COVID-19 is considered infectious starting 2 days before they developed symptoms, or 2 days before the date of their positive test if they do not have symptoms. Get tested again at least 5 days after the end of isolation for the most recently infected person that lives with you. Wear a well-fitting mask when you are around any person with COVID-19 while that person is in isolation. Wear a well-fitting mask around others for 10 days after the end of isolation for the most recently infected person that lives with you. For example, if the last day of isolation for the person most recently infected with COVID-19 was June 30, the new 10-day period to wear a  well-fitting mask indoors in public starts on July 1. Isolate immediately if you develop symptoms of COVID-19 or test positive. I had COVID-19 and completed isolation. Do I have to quarantine or get tested if someone I live with gets COVID-19 shortly after I completed isolation? No. If you recently completed isolation and someone that lives with you tests positive for the virus that causes COVID-19 shortly after the end of your isolation period, you do not have to quarantine or get tested as long as you do not develop new symptoms. Once all of the people that live together have completed isolation or quarantine, refer to the guidance below for new exposures to COVID-19. If you had COVID-19 in the previous 90 days and then came into close contact with someone with COVID-19, you do not have to quarantine or get tested if you do not have symptoms. But you should: Wear a well-fitting mask indoors in public for 10 days after your last close contact. Monitor for COVID-19 symptoms for 10 days from the date of your last close contact. Isolate immediately and get tested if symptoms develop. If more than 90 days have passed since your recovery from infection, follow CDC's recommendations for close contacts. These recommendations will differ depending on your vaccination status. 05/24/2020 Content source: [facility] for Immunization and Respiratory Diseases ([facility]), Division of Viral Diseases This information is not intended to replace advice given to you by your health care provider. Make sure you discuss any questions you have with your health care provider. Document Revised: 09/27/2020 Document Reviewed: 09/27/2020 Elsevier Patient Education  2022 [employer].    If you have been instructed to have an in-person evaluation today at a local Urgent Care facility, please use the link below. It will take you to a list of all of our available [facility], including address, phone number and  hours of operation. Please do not delay care.  [facility]  If you or a family member do not have a primary care provider, use the link below to schedule a visit and establish care. When you choose a [facility] primary care physician or advanced practice provider, you gain a long-term partner in health. Find a Primary Care Provider  Learn more about 's in-office and virtual care options: [facility]

## 2021-03-16 DIAGNOSIS — M9902 Segmental and somatic dysfunction of thoracic region: Secondary | ICD-10-CM | POA: Diagnosis not present

## 2021-03-16 DIAGNOSIS — M9903 Segmental and somatic dysfunction of lumbar region: Secondary | ICD-10-CM | POA: Diagnosis not present

## 2021-03-16 DIAGNOSIS — M9901 Segmental and somatic dysfunction of cervical region: Secondary | ICD-10-CM | POA: Diagnosis not present

## 2021-03-16 DIAGNOSIS — M9905 Segmental and somatic dysfunction of pelvic region: Secondary | ICD-10-CM | POA: Diagnosis not present

## 2021-03-19 DIAGNOSIS — M9905 Segmental and somatic dysfunction of pelvic region: Secondary | ICD-10-CM | POA: Diagnosis not present

## 2021-03-19 DIAGNOSIS — M9901 Segmental and somatic dysfunction of cervical region: Secondary | ICD-10-CM | POA: Diagnosis not present

## 2021-03-19 DIAGNOSIS — M9902 Segmental and somatic dysfunction of thoracic region: Secondary | ICD-10-CM | POA: Diagnosis not present

## 2021-03-19 DIAGNOSIS — M9903 Segmental and somatic dysfunction of lumbar region: Secondary | ICD-10-CM | POA: Diagnosis not present

## 2021-04-05 DIAGNOSIS — M9903 Segmental and somatic dysfunction of lumbar region: Secondary | ICD-10-CM | POA: Diagnosis not present

## 2021-04-05 DIAGNOSIS — M9901 Segmental and somatic dysfunction of cervical region: Secondary | ICD-10-CM | POA: Diagnosis not present

## 2021-04-05 DIAGNOSIS — M9905 Segmental and somatic dysfunction of pelvic region: Secondary | ICD-10-CM | POA: Diagnosis not present

## 2021-04-05 DIAGNOSIS — M9902 Segmental and somatic dysfunction of thoracic region: Secondary | ICD-10-CM | POA: Diagnosis not present

## 2021-05-04 DIAGNOSIS — M9901 Segmental and somatic dysfunction of cervical region: Secondary | ICD-10-CM | POA: Diagnosis not present

## 2021-05-04 DIAGNOSIS — M9905 Segmental and somatic dysfunction of pelvic region: Secondary | ICD-10-CM | POA: Diagnosis not present

## 2021-05-04 DIAGNOSIS — M9903 Segmental and somatic dysfunction of lumbar region: Secondary | ICD-10-CM | POA: Diagnosis not present

## 2021-05-04 DIAGNOSIS — M9902 Segmental and somatic dysfunction of thoracic region: Secondary | ICD-10-CM | POA: Diagnosis not present

## 2021-07-02 DIAGNOSIS — M9901 Segmental and somatic dysfunction of cervical region: Secondary | ICD-10-CM | POA: Diagnosis not present

## 2021-07-02 DIAGNOSIS — M9902 Segmental and somatic dysfunction of thoracic region: Secondary | ICD-10-CM | POA: Diagnosis not present

## 2021-07-02 DIAGNOSIS — M9905 Segmental and somatic dysfunction of pelvic region: Secondary | ICD-10-CM | POA: Diagnosis not present

## 2021-07-02 DIAGNOSIS — M9903 Segmental and somatic dysfunction of lumbar region: Secondary | ICD-10-CM | POA: Diagnosis not present

## 2021-07-13 DIAGNOSIS — M9905 Segmental and somatic dysfunction of pelvic region: Secondary | ICD-10-CM | POA: Diagnosis not present

## 2021-07-13 DIAGNOSIS — M9903 Segmental and somatic dysfunction of lumbar region: Secondary | ICD-10-CM | POA: Diagnosis not present

## 2021-07-13 DIAGNOSIS — M9902 Segmental and somatic dysfunction of thoracic region: Secondary | ICD-10-CM | POA: Diagnosis not present

## 2021-07-13 DIAGNOSIS — M9901 Segmental and somatic dysfunction of cervical region: Secondary | ICD-10-CM | POA: Diagnosis not present

## 2021-10-11 ENCOUNTER — Other Ambulatory Visit: Payer: BC Managed Care – PPO

## 2021-10-11 ENCOUNTER — Ambulatory Visit (INDEPENDENT_AMBULATORY_CARE_PROVIDER_SITE_OTHER): Payer: BC Managed Care – PPO | Admitting: Obstetrics & Gynecology

## 2021-10-11 ENCOUNTER — Encounter: Payer: Self-pay | Admitting: Obstetrics & Gynecology

## 2021-10-11 ENCOUNTER — Telehealth: Payer: Self-pay | Admitting: *Deleted

## 2021-10-11 ENCOUNTER — Other Ambulatory Visit (HOSPITAL_COMMUNITY)
Admission: RE | Admit: 2021-10-11 | Discharge: 2021-10-11 | Disposition: A | Discharge status: Home / Self Care | Payer: BC Managed Care – PPO | Source: Ambulatory Visit | Attending: Obstetrics & Gynecology | Admitting: Obstetrics & Gynecology

## 2021-10-11 VITALS — BP 132/88 | HR 90 | Resp 14 | Ht 68.5 in | Wt 208.0 lb

## 2021-10-11 DIAGNOSIS — N870 Mild cervical dysplasia: Secondary | ICD-10-CM | POA: Diagnosis not present

## 2021-10-11 DIAGNOSIS — Z3041 Encounter for surveillance of contraceptive pills: Secondary | ICD-10-CM

## 2021-10-11 DIAGNOSIS — Z01419 Encounter for gynecological examination (general) (routine) without abnormal findings: Secondary | ICD-10-CM | POA: Insufficient documentation

## 2021-10-11 MED ORDER — HAILEY 24 FE 1-20 MG-MCG(24) PO TABS: 1.0000 | ORAL_TABLET | Freq: Every day | ORAL | 4 refills | Status: AC

## 2021-10-11 NOTE — Telephone Encounter (Signed)
Dr. Seymour Bars see below.

## 2021-10-11 NOTE — Progress Notes (Signed)
Angela Joseph March 17, 1985 [id number]   History:    36 y.o. G0 Single   RP:  Established patient presenting for annual gyn exam    HPI: Well on Blisovi.  Occasional BTB.  No Pelvic pain.  Currently abstinent.  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  HPV 16-18-45 negative.  Last Pap ASCUS/HPV HR Negative 10/09/2020.  Pap Reflex today. Urine/BMs wnl.  Breasts wnl. BMI 31.17.  Physically active.  F/U Fasting Health Labs here.  Past medical history,surgical history, family history and social history were all reviewed and documented in the EPIC chart.  Gynecologic History No LMP recorded. (Menstrual status: Oral contraceptives).  Obstetric History OB History  Gravida Para Term Preterm AB Living  0 0 0 0 0 0  SAB IAB Ectopic Multiple Live Births  0 0 0 0 0     ROS: A ROS was performed and pertinent positives and negatives are included in the history. GENERAL: No fevers or chills. HEENT: No change in vision, no earache, sore throat or sinus congestion. NECK: No pain or stiffness. CARDIOVASCULAR: No chest pain or pressure. No palpitations. PULMONARY: No shortness of breath, cough or wheeze. GASTROINTESTINAL: No abdominal pain, nausea, vomiting or diarrhea, melena or bright red blood per rectum. GENITOURINARY: No urinary frequency, urgency, hesitancy or dysuria. MUSCULOSKELETAL: No joint or muscle pain, no back pain, no recent trauma. DERMATOLOGIC: No rash, no itching, no lesions. ENDOCRINE: No polyuria, polydipsia, no heat or cold intolerance. No recent change in weight. HEMATOLOGICAL: No anemia or easy bruising or bleeding. NEUROLOGIC: No headache, seizures, numbness, tingling or weakness. PSYCHIATRIC: No depression, no loss of interest in normal activity or change in sleep pattern.     Exam:   BP 132/88 (BP Location: Left Arm, Patient Position: Sitting, Cuff Size: Normal)   Pulse 90   Resp 14   Ht 5' 8.5" (1.74 m)   Wt 208 lb (94.3 kg)   BMI 31.17 kg/m   Body mass index is 31.17  kg/m.  General appearance : Well developed well nourished female. No acute distress HEENT: Eyes: no retinal hemorrhage or exudates,  Neck supple, trachea midline, no carotid bruits, no thyroidmegaly Lungs: Clear to auscultation, no rhonchi or wheezes, or rib retractions  Heart: Regular rate and rhythm, no murmurs or gallops Breast:Examined in sitting and supine position were symmetrical in appearance, no palpable masses or tenderness,  no skin retraction, no nipple inversion, no nipple discharge, no skin discoloration, no axillary or supraclavicular lymphadenopathy Abdomen: no palpable masses or tenderness, no rebound or guarding Extremities: no edema or skin discoloration or tenderness  Pelvic: Vulva: Normal             Vagina: No gross lesions or discharge.  Pap reflex done.  Cervix: AV, normal size, shape and consistency, non-tender and mobile  Adnexa  Without masses or tenderness  Anus: Normal   Assessment/Plan:  36 y.o. female for annual exam   1. Encounter for routine gynecological examination with Papanicolaou smear of cervix Well on Blisovi.  Occasional BTB.  No Pelvic pain.  Currently abstinent.  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  HPV 16-18-45 negative.  Last Pap ASCUS/HPV HR Negative 10/09/2020.  Pap Reflex today. Urine/BMs wnl.  Breasts wnl. BMI 31.17.  Physically active.  F/U Fasting Health Labs here. - CBC; Future - Comp Met (CMET); Future - Lipid Profile; Future - TSH; Future - Vitamin D (25 hydroxy); Future - Cytology - PAP( [facility])  2. Dysplasia of cervix, low  grade (CIN 1) - Cytology - PAP( [facility])  3. Encounter for surveillance of contraceptive pills Well on Blisovi.  Occasional BTB.  No Pelvic pain.  Currently abstinent. No CI to continue on BCPs.  Prescription sent to pharmacy.  Other orders - VITAMIN D PO; Take by mouth. - Norethindrone Acetate-Ethinyl Estrad-FE (HAILEY 24 FE) 1-20 MG-MCG(24) tablet; Take 1 tablet by mouth daily. Continuous use for  Dysmeno/Migraines   Princess Bruins MD, 10:13 AM 10/11/2021

## 2021-10-11 NOTE — Telephone Encounter (Signed)
-----   Message from [facility] sent at 10/11/2021  1:56 PM EDT ----- Pt stated per ml, needed to come in for labs tomorrow but labs not in chart, can you please add them

## 2021-10-12 ENCOUNTER — Other Ambulatory Visit: Payer: BC Managed Care – PPO

## 2021-10-12 DIAGNOSIS — Z01419 Encounter for gynecological examination (general) (routine) without abnormal findings: Secondary | ICD-10-CM | POA: Diagnosis not present

## 2021-10-12 DIAGNOSIS — E782 Mixed hyperlipidemia: Secondary | ICD-10-CM | POA: Diagnosis not present

## 2021-10-12 DIAGNOSIS — R7303 Prediabetes: Secondary | ICD-10-CM | POA: Diagnosis not present

## 2021-10-13 LAB — LIPID PANEL
Cholesterol: 169 mg/dL (ref <200)
HDL: 42 mg/dL — ABNORMAL LOW (ref >=50)
LDL Cholesterol (Calc): 110 mg/dL (calc) — ABNORMAL HIGH
Non-HDL Cholesterol (Calc): 127 mg/dL (calc) (ref <130)
Total CHOL/HDL Ratio: 4 (calc) (ref <5.0)
Triglycerides: 84 mg/dL (ref <150)

## 2021-10-13 LAB — COMPREHENSIVE METABOLIC PANEL
AG Ratio: 1.5 (calc) (ref 1.0–2.5)
ALT: 13 U/L (ref 6–29)
AST: 18 U/L (ref 10–30)
Albumin: 4.1 g/dL (ref 3.6–5.1)
Alkaline phosphatase (APISO): 134 U/L — ABNORMAL HIGH (ref 31–125)
BUN: 10 mg/dL (ref 7–25)
CO2: 24 mmol/L (ref 20–32)
Calcium: 9.6 mg/dL (ref 8.6–10.2)
Chloride: 108 mmol/L (ref 98–110)
Creat: 0.92 mg/dL (ref 0.50–0.97)
Globulin: 2.7 g/dL (calc) (ref 1.9–3.7)
Glucose, Bld: 93 mg/dL (ref 65–99)
Potassium: 4.5 mmol/L (ref 3.5–5.3)
Sodium: 141 mmol/L (ref 135–146)
Total Bilirubin: 0.4 mg/dL (ref 0.2–1.2)
Total Protein: 6.8 g/dL (ref 6.1–8.1)

## 2021-10-13 LAB — CBC
HCT: 43.9 % (ref 35.0–45.0)
Hemoglobin: 14.6 g/dL (ref 11.7–15.5)
MCH: 28.3 pg (ref 27.0–33.0)
MCHC: 33.3 g/dL (ref 32.0–36.0)
MCV: 85.2 fL (ref 80.0–100.0)
MPV: 9.6 fL (ref 7.5–12.5)
Platelets: 421 10*3/uL — ABNORMAL HIGH (ref 140–400)
RBC: 5.15 10*6/uL — ABNORMAL HIGH (ref 3.80–5.10)
RDW: 13 % (ref 11.0–15.0)
WBC: 7.2 10*3/uL (ref 3.8–10.8)

## 2021-10-13 LAB — VITAMIN D 25 HYDROXY (VIT D DEFICIENCY, FRACTURES): Vit D, 25-Hydroxy: 40 ng/mL (ref 30–100)

## 2021-10-13 LAB — TSH: TSH: 1.56 mIU/L

## 2021-10-18 ENCOUNTER — Telehealth: Payer: Self-pay | Admitting: *Deleted

## 2021-10-18 LAB — CYTOLOGY - PAP
Comment: NEGATIVE
Diagnosis: UNDETERMINED — AB
High risk HPV: NEGATIVE

## 2021-10-18 NOTE — Telephone Encounter (Signed)
-----   Message from [name], MD sent at 10/18/2021  2:01 PM EDT ----- ASCUS/HPV HR Negative.  ASCUS as well in 2019.  Schedule Colposcopy.

## 2021-10-18 NOTE — Telephone Encounter (Signed)
Message left to return call to Lawernce Earll at [phone].   

## 2021-10-19 NOTE — Telephone Encounter (Signed)
Dr.Lavoie replied "From my note 09/2021:   - CBC; Future  - Comp Met (CMET); Future  - Lipid Profile; Future  - TSH; Future  - Vitamin D (25 hydroxy); Future '    Patient came for labs on 10/12/21

## 2021-10-25 ENCOUNTER — Other Ambulatory Visit: Payer: Self-pay

## 2021-10-25 DIAGNOSIS — R87610 Atypical squamous cells of undetermined significance on cytologic smear of cervix (ASC-US): Secondary | ICD-10-CM

## 2021-11-12 ENCOUNTER — Ambulatory Visit (INDEPENDENT_AMBULATORY_CARE_PROVIDER_SITE_OTHER): Payer: BC Managed Care – PPO | Admitting: Obstetrics & Gynecology

## 2021-11-12 ENCOUNTER — Other Ambulatory Visit (HOSPITAL_COMMUNITY)
Admission: RE | Admit: 2021-11-12 | Discharge: 2021-11-12 | Disposition: A | Discharge status: Home / Self Care | Payer: BC Managed Care – PPO | Source: Ambulatory Visit | Attending: Obstetrics & Gynecology | Admitting: Obstetrics & Gynecology

## 2021-11-12 ENCOUNTER — Encounter: Payer: Self-pay | Admitting: Obstetrics & Gynecology

## 2021-11-12 VITALS — BP 148/88 | HR 75 | Resp 18

## 2021-11-12 DIAGNOSIS — Z01812 Encounter for preprocedural laboratory examination: Secondary | ICD-10-CM

## 2021-11-12 DIAGNOSIS — M9901 Segmental and somatic dysfunction of cervical region: Secondary | ICD-10-CM | POA: Diagnosis not present

## 2021-11-12 DIAGNOSIS — M9902 Segmental and somatic dysfunction of thoracic region: Secondary | ICD-10-CM | POA: Diagnosis not present

## 2021-11-12 DIAGNOSIS — M9903 Segmental and somatic dysfunction of lumbar region: Secondary | ICD-10-CM | POA: Diagnosis not present

## 2021-11-12 DIAGNOSIS — N870 Mild cervical dysplasia: Secondary | ICD-10-CM | POA: Diagnosis not present

## 2021-11-12 DIAGNOSIS — M9905 Segmental and somatic dysfunction of pelvic region: Secondary | ICD-10-CM | POA: Diagnosis not present

## 2021-11-12 DIAGNOSIS — R87610 Atypical squamous cells of undetermined significance on cytologic smear of cervix (ASC-US): Secondary | ICD-10-CM | POA: Diagnosis not present

## 2021-11-12 LAB — PREGNANCY, URINE: Preg Test, Ur: NEGATIVE

## 2021-11-12 NOTE — Progress Notes (Signed)
    Angela Joseph [birth year] [id number]        36 y.o.  G0  RP: ASCUS x 2 with HPV HR Neg/H/O CIN 1 in 2018 for Colposcopy  HPI: ASCUS x 2 with HPV HR Neg in 09/2020 and 09/2021.  H/O CIN 1 in 2018.   OB History  Gravida Para Term Preterm AB Living  0 0 0 0 0 0  SAB IAB Ectopic Multiple Live Births  0 0 0 0 0    Past medical history,surgical history, problem list, medications, allergies, family history and social history were all reviewed and documented in the EPIC chart.   Directed ROS with pertinent positives and negatives documented in the history of present illness/assessment and plan.  Exam:  Vitals:   11/12/21 1600  BP: (!) 148/88  Pulse: 75  Resp: 18  SpO2: 97%   General appearance:  Normal  UPT Neg  Colposcopy Procedure Note Tika D Hirschhorn 11/12/2021  Indications: ASCUS x 2 with HPV HR Neg in 09/2020 and 09/2021.  H/O CIN 1 in 2018.  Procedure Details  The risks and benefits of the procedure and Written informed consent obtained.  Speculum placed in vagina and excellent visualization of cervix achieved, cervix swabbed x 3 with acetic acid solution.  Findings:  Cervix colposcopy: Physical Exam Genitourinary:       Vaginal colposcopy: Normal  Vulvar colposcopy: Normal  Perirectal colposcopy: Normal  The cervix was sprayed with Hurricane before performing the cervical biopsies.  Specimens: Cervical Bxs at 3, 6 and 9 O'Clock  Complications: None, good hemostasis with Silver Nitrate and Monsel. . Plan:  Management per patho results   Assessment/Plan:  36 y.o. G0  1. ASCUS of cervix with negative high risk HPV ASCUS x 2 with HPV HR Neg in 09/2020 and 09/2021.  H/O CIN 1 in 2018. Counseling on ASCUS/HPV HR Neg done.  Colpo findings reviewed with patient.  Management per Cervical Bx results.  Post procedure precautions. - Colposcopy - Surgical pathology( [place]/ [facility])  2. Pre-procedure lab exam UPT Neg - Pregnancy, urine    Princess Bruins MD, 4:09 PM 11/12/2021

## 2021-11-14 LAB — SURGICAL PATHOLOGY

## 2021-12-06 DIAGNOSIS — M9905 Segmental and somatic dysfunction of pelvic region: Secondary | ICD-10-CM | POA: Diagnosis not present

## 2021-12-06 DIAGNOSIS — M9903 Segmental and somatic dysfunction of lumbar region: Secondary | ICD-10-CM | POA: Diagnosis not present

## 2021-12-06 DIAGNOSIS — M9902 Segmental and somatic dysfunction of thoracic region: Secondary | ICD-10-CM | POA: Diagnosis not present

## 2021-12-06 DIAGNOSIS — M9901 Segmental and somatic dysfunction of cervical region: Secondary | ICD-10-CM | POA: Diagnosis not present

## 2021-12-21 DIAGNOSIS — M9901 Segmental and somatic dysfunction of cervical region: Secondary | ICD-10-CM | POA: Diagnosis not present

## 2021-12-21 DIAGNOSIS — M9903 Segmental and somatic dysfunction of lumbar region: Secondary | ICD-10-CM | POA: Diagnosis not present

## 2021-12-21 DIAGNOSIS — M9902 Segmental and somatic dysfunction of thoracic region: Secondary | ICD-10-CM | POA: Diagnosis not present

## 2021-12-21 DIAGNOSIS — M9905 Segmental and somatic dysfunction of pelvic region: Secondary | ICD-10-CM | POA: Diagnosis not present

## 2022-01-21 DIAGNOSIS — M9903 Segmental and somatic dysfunction of lumbar region: Secondary | ICD-10-CM | POA: Diagnosis not present

## 2022-01-21 DIAGNOSIS — M9901 Segmental and somatic dysfunction of cervical region: Secondary | ICD-10-CM | POA: Diagnosis not present

## 2022-01-21 DIAGNOSIS — M9905 Segmental and somatic dysfunction of pelvic region: Secondary | ICD-10-CM | POA: Diagnosis not present

## 2022-01-21 DIAGNOSIS — M9902 Segmental and somatic dysfunction of thoracic region: Secondary | ICD-10-CM | POA: Diagnosis not present

## 2022-02-07 NOTE — Telephone Encounter (Signed)
Colposcopy done on 11-12-21. See notes.

## 2022-02-20 DIAGNOSIS — M9905 Segmental and somatic dysfunction of pelvic region: Secondary | ICD-10-CM | POA: Diagnosis not present

## 2022-02-20 DIAGNOSIS — M9903 Segmental and somatic dysfunction of lumbar region: Secondary | ICD-10-CM | POA: Diagnosis not present

## 2022-02-20 DIAGNOSIS — M9901 Segmental and somatic dysfunction of cervical region: Secondary | ICD-10-CM | POA: Diagnosis not present

## 2022-02-20 DIAGNOSIS — M9902 Segmental and somatic dysfunction of thoracic region: Secondary | ICD-10-CM | POA: Diagnosis not present

## 2022-03-15 DIAGNOSIS — M9905 Segmental and somatic dysfunction of pelvic region: Secondary | ICD-10-CM | POA: Diagnosis not present

## 2022-03-15 DIAGNOSIS — M9903 Segmental and somatic dysfunction of lumbar region: Secondary | ICD-10-CM | POA: Diagnosis not present

## 2022-03-15 DIAGNOSIS — M9902 Segmental and somatic dysfunction of thoracic region: Secondary | ICD-10-CM | POA: Diagnosis not present

## 2022-03-15 DIAGNOSIS — M9901 Segmental and somatic dysfunction of cervical region: Secondary | ICD-10-CM | POA: Diagnosis not present

## 2022-03-24 ENCOUNTER — Ambulatory Visit (HOSPITAL_COMMUNITY)
Admission: EM | Admit: 2022-03-24 | Discharge: 2022-03-24 | Disposition: A | Discharge status: Home / Self Care | Payer: BC Managed Care – PPO | Attending: Family Medicine | Admitting: Family Medicine

## 2022-03-24 ENCOUNTER — Encounter (HOSPITAL_COMMUNITY): Payer: Self-pay

## 2022-03-24 DIAGNOSIS — J069 Acute upper respiratory infection, unspecified: Secondary | ICD-10-CM | POA: Diagnosis not present

## 2022-03-24 DIAGNOSIS — R058 Other specified cough: Secondary | ICD-10-CM | POA: Insufficient documentation

## 2022-03-24 DIAGNOSIS — Z1152 Encounter for screening for COVID-19: Secondary | ICD-10-CM | POA: Insufficient documentation

## 2022-03-24 MED ORDER — PROMETHAZINE-DM 6.25-15 MG/5ML PO SYRP: 5.0000 mL | ORAL_SOLUTION | Freq: Three times a day (TID) | ORAL | 0 refills | Status: DC | PRN

## 2022-03-24 MED ORDER — ACETAMINOPHEN 325 MG PO TABS
ORAL_TABLET | ORAL | Status: AC
  Filled 2022-03-24: qty 3

## 2022-03-24 MED ORDER — FLUTICASONE PROPIONATE 50 MCG/ACT NA SUSP: 1.0000 | Freq: Every day | NASAL | 0 refills | Status: DC

## 2022-03-24 MED ORDER — ACETAMINOPHEN 325 MG PO TABS
975.0000 mg | ORAL_TABLET | Freq: Once | ORAL | Status: AC
  Administered 2022-03-24: 975 mg via ORAL

## 2022-03-24 NOTE — Discharge Instructions (Signed)
I am concerned that you have a virus.  We will contact you if you are positive for COVID.  Please monitor your MyChart for these results.  Use promethazine DM for cough.  This will make you sleepy so do not drive or drink alcohol with taking it.  Use Flonase to help with your congestion.  Make sure that you rest and drink plenty of fluid.  If your symptoms or not improving within a week please return for reevaluation.  If you have any worsening symptoms including worsening cough, fever not respond to medication, chest pain, shortness of breath, weakness, nausea/vomiting interfere with oral intake you need to be seen immediately.

## 2022-03-24 NOTE — ED Triage Notes (Signed)
Pts states that she has had fever 101.7. coughing, fatigue, sore throat, brain fog. Since Thursday. Taken sudafed

## 2022-03-24 NOTE — ED Provider Notes (Signed)
[facility]    CSN: [id number] Arrival date & time: 03/24/22  1639      History   Chief Complaint Chief Complaint  Patient presents with   Fever    Would like to get a PCR test done.  Fever, Coughing, Fatigue, but home test negative. - Entered by patient   Cough   Fatigue   Sore Throat    HPI Angela Joseph is a 37 y.o. female.   Patient presents today with a 3 to 4-day history of URI symptoms including fever, body aches, cough, fatigue, sore throat, congestion.  Denies any nausea, vomiting, diarrhea, chest pain, shortness of breath.  Reports current symptoms are similar to previous episodes of COVID-19.  She has taken COVID-19 test at home that were negative.  She has had vaccines but is behind on her boosters.  She has not had an influenza vaccine.  She denies any significant past medical history including COPD, asthma, diabetes, immunosuppression.  She does not smoke.  Denies any recent antibiotics or steroids.  She has been taking Sudafed without improvement of symptoms.  She is confident that she is not pregnant.    Past Medical History:  Diagnosis Date   Allergy    Migraine     Patient Active Problem List   Diagnosis Date Noted   Acute appendicitis 12/06/2018    Past Surgical History:  Procedure Laterality Date   DENTAL SURGERY     KNEE SURGERY     LAPAROSCOPIC APPENDECTOMY N/A 12/06/2018   Procedure: APPENDECTOMY LAPAROSCOPIC;  Surgeon: Erroll Luna, MD;  Location: [facility];  Service: General;  Laterality: N/A;    OB History     Gravida  0   Para  0   Term  0   Preterm  0   AB  0   Living  0      SAB  0   IAB  0   Ectopic  0   Multiple  0   Live Births  0            Home Medications    Prior to Admission medications   Medication Sig Start Date End Date Taking? Authorizing Provider  fluticasone (FLONASE) 50 MCG/ACT nasal spray Place 1 spray into both nostrils daily. 03/24/22  Yes Jontay Maston K, PA-C  Norethindrone  Acetate-Ethinyl Estrad-FE (HAILEY 24 FE) 1-20 MG-MCG(24) tablet Take 1 tablet by mouth daily. Continuous use for Dysmeno/Migraines 10/11/21  Yes Princess Bruins, MD  promethazine-dextromethorphan (PROMETHAZINE-DM) 6.25-15 MG/5ML syrup Take 5 mLs by mouth 3 (three) times daily as needed for cough. 03/24/22  Yes Sarra Rachels K, PA-C  VITAMIN D PO Take by mouth.   Yes [provider]    Family History Family History  Problem Relation Age of Onset   Hypertension Father    Cancer Paternal Grandmother     Social History Social History   Tobacco Use   Smoking status: Never   Smokeless tobacco: Never  Vaping Use   Vaping Use: Never used  Substance Use Topics   Alcohol use: Yes   Drug use: No     Allergies   Tamiflu [oseltamivir phosphate], Bee venom, Ceclor [cefaclor], Cleocin [clindamycin hcl], Other, Penicillins, and Zinc   Review of Systems Review of Systems  Constitutional:  Positive for activity change, fatigue and fever. Negative for appetite change.  HENT:  Positive for congestion and sore throat. Negative for sinus pressure and sneezing.   Respiratory:  Positive for cough. Negative for shortness  of breath.   Cardiovascular:  Negative for chest pain.  Gastrointestinal:  Negative for abdominal pain, diarrhea, nausea and vomiting.  Musculoskeletal:  Positive for arthralgias and myalgias.  Neurological:  Negative for dizziness, light-headedness and headaches.     Physical Exam Triage Vital Signs ED Triage Vitals  Enc Vitals Group     BP 03/24/22 1748 (!) 143/93     Pulse Rate 03/24/22 1748 (!) 133     Resp --      Temp 03/24/22 1748 98.6 F (37 C)     Temp Source 03/24/22 1748 Oral     SpO2 03/24/22 1748 95 %     Weight 03/24/22 1746 205 lb (93 kg)     Height --      Head Circumference --      Peak Flow --      Pain Score 03/24/22 1746 3     Pain Loc --      Pain Edu? --      Excl. in [place]? --    No data found.  Updated Vital Signs BP (!) 143/93  (BP Location: Right Arm)   Pulse (!) 104   Temp 98.8 F (37.1 C) (Oral)   Wt 205 lb (93 kg)   LMP 03/24/2022   SpO2 95%   BMI 30.72 kg/m   Visual Acuity Right Eye Distance:   Left Eye Distance:   Bilateral Distance:    Right Eye Near:   Left Eye Near:    Bilateral Near:     Physical Exam Vitals reviewed.  Constitutional:      General: She is awake. She is not in acute distress.    Appearance: Normal appearance. She is well-developed. She is not ill-appearing.     Comments: Very pleasant female appears stated age in no acute distress sitting comfortably in exam room  HENT:     Head: Normocephalic and atraumatic.     Right Ear: Tympanic membrane, ear canal and external ear normal. Tympanic membrane is not erythematous or bulging.     Left Ear: Tympanic membrane, ear canal and external ear normal. Tympanic membrane is not erythematous or bulging.     Nose:     Right Sinus: No maxillary sinus tenderness or frontal sinus tenderness.     Left Sinus: No maxillary sinus tenderness or frontal sinus tenderness.     Mouth/Throat:     Pharynx: Uvula midline. No oropharyngeal exudate or posterior oropharyngeal erythema.  Cardiovascular:     Rate and Rhythm: Regular rhythm. Tachycardia present.     Heart sounds: Normal heart sounds, S1 normal and S2 normal. No murmur heard. Pulmonary:     Effort: Pulmonary effort is normal.     Breath sounds: Normal breath sounds. No wheezing, rhonchi or rales.     Comments: Reactive cough with deep breathing Psychiatric:        Behavior: Behavior is cooperative.      UC Treatments / Results  Labs (all labs ordered are listed, but only abnormal results are displayed) Labs Reviewed  SARS CORONAVIRUS 2 (TAT 6-24 HRS)    EKG   Radiology No results found.  Procedures Procedures (including critical care time)  Medications Ordered in UC Medications  acetaminophen (TYLENOL) tablet 975 mg (975 mg Oral Given 03/24/22 1802)    Initial  Impression / Assessment and Plan / UC Course  I have reviewed the triage vital signs and the nursing notes.  Pertinent labs & imaging results that were available during my care  of the patient were reviewed by me and considered in my medical decision making (see chart for details).     Patient was initially tachycardic on intake but I suspect she was in the process of developing a fever despite being afebrile in clinic and was given a dose of Tylenol.  Tachycardia improved following this medication.  She is otherwise well-appearing and nontoxic.  No evidence of acute infection on physical exam that would warrant initiation of antibiotics.  Discussed likely viral etiology.  She is outside the window of effectiveness for Tamiflu (and is allergic to it) so no indication for viral testing for influenza.  COVID PCR testing was obtained today and is pending.  Patient is not a candidate for antiviral therapy as she is otherwise healthy and has been vaccinated.  Recommended conservative treatment measures.  She was prescribed Promethazine DM for cough with instruction not to drive drink alcohol with taking this medication as drowsiness is a common side effect.  She can use Mucinex as well as Flonase to help manage her congestion symptoms.  Recommend she alternate Tylenol and ibuprofen to manage fever.  She is to rest and drink plenty of fluid.  If her symptoms or not improving within a week she is to return for reevaluation.  If she has any worsening symptoms including chest pain, shortness of breath, fever not responding to medication, weakness, nausea, vomiting she needs to be seen immediately.  Strict return precautions given.  Work excuse note with current CDC return to work guidelines provided during visit today.  Final Clinical Impressions(s) / UC Diagnoses   Final diagnoses:  Viral URI with cough     Discharge Instructions      I am concerned that you have a virus.  We will contact you if you are  positive for COVID.  Please monitor your MyChart for these results.  Use promethazine DM for cough.  This will make you sleepy so do not drive or drink alcohol with taking it.  Use Flonase to help with your congestion.  Make sure that you rest and drink plenty of fluid.  If your symptoms or not improving within a week please return for reevaluation.  If you have any worsening symptoms including worsening cough, fever not respond to medication, chest pain, shortness of breath, weakness, nausea/vomiting interfere with oral intake you need to be seen immediately.     ED Prescriptions     Medication Sig Dispense Auth. Provider   promethazine-dextromethorphan (PROMETHAZINE-DM) 6.25-15 MG/5ML syrup Take 5 mLs by mouth 3 (three) times daily as needed for cough. 118 mL Angela Joseph K, PA-C   fluticasone (FLONASE) 50 MCG/ACT nasal spray Place 1 spray into both nostrils daily. 16 g Angela Joseph K, PA-C      PDMP not reviewed this encounter.   Angela Hawking, PA-C 03/24/22 1829

## 2022-03-24 NOTE — ED Notes (Signed)
[name], pa notified of elevated heart rate.  Question accuracy of temperature-patient has had liquids in the last 15 minutes, requested patient wait for temp recheck.

## 2022-03-25 LAB — SARS CORONAVIRUS 2 (TAT 6-24 HRS): SARS Coronavirus 2: NEGATIVE

## 2022-04-15 DIAGNOSIS — M9901 Segmental and somatic dysfunction of cervical region: Secondary | ICD-10-CM | POA: Diagnosis not present

## 2022-04-15 DIAGNOSIS — M9903 Segmental and somatic dysfunction of lumbar region: Secondary | ICD-10-CM | POA: Diagnosis not present

## 2022-04-15 DIAGNOSIS — M9902 Segmental and somatic dysfunction of thoracic region: Secondary | ICD-10-CM | POA: Diagnosis not present

## 2022-04-15 DIAGNOSIS — M9905 Segmental and somatic dysfunction of pelvic region: Secondary | ICD-10-CM | POA: Diagnosis not present

## 2022-04-19 DIAGNOSIS — M9901 Segmental and somatic dysfunction of cervical region: Secondary | ICD-10-CM | POA: Diagnosis not present

## 2022-04-19 DIAGNOSIS — M9905 Segmental and somatic dysfunction of pelvic region: Secondary | ICD-10-CM | POA: Diagnosis not present

## 2022-04-19 DIAGNOSIS — M9903 Segmental and somatic dysfunction of lumbar region: Secondary | ICD-10-CM | POA: Diagnosis not present

## 2022-04-19 DIAGNOSIS — M9902 Segmental and somatic dysfunction of thoracic region: Secondary | ICD-10-CM | POA: Diagnosis not present

## 2022-04-26 DIAGNOSIS — M9901 Segmental and somatic dysfunction of cervical region: Secondary | ICD-10-CM | POA: Diagnosis not present

## 2022-04-26 DIAGNOSIS — M9903 Segmental and somatic dysfunction of lumbar region: Secondary | ICD-10-CM | POA: Diagnosis not present

## 2022-04-26 DIAGNOSIS — M9902 Segmental and somatic dysfunction of thoracic region: Secondary | ICD-10-CM | POA: Diagnosis not present

## 2022-04-26 DIAGNOSIS — M9905 Segmental and somatic dysfunction of pelvic region: Secondary | ICD-10-CM | POA: Diagnosis not present

## 2022-05-20 DIAGNOSIS — M9903 Segmental and somatic dysfunction of lumbar region: Secondary | ICD-10-CM | POA: Diagnosis not present

## 2022-05-20 DIAGNOSIS — M9901 Segmental and somatic dysfunction of cervical region: Secondary | ICD-10-CM | POA: Diagnosis not present

## 2022-05-20 DIAGNOSIS — M9905 Segmental and somatic dysfunction of pelvic region: Secondary | ICD-10-CM | POA: Diagnosis not present

## 2022-05-20 DIAGNOSIS — M9902 Segmental and somatic dysfunction of thoracic region: Secondary | ICD-10-CM | POA: Diagnosis not present

## 2022-06-07 ENCOUNTER — Telehealth: Payer: Self-pay | Admitting: *Deleted

## 2022-06-07 NOTE — Telephone Encounter (Signed)
10/11/21 PAP ASCUS, neg HPV; ASCUS 2019 11/12/21 Colpo CIN 1, repeat PAP 6 months.  Not scheduled to date.   Spoke with patient, advised as seen above. Patient agreeable to schedule.  OV scheduled for 06/21/22 at 1330 with Dr. Seymour Bars.  Patient verbalizes understanding and is agreeable.   Routing to provider for final review. Patient is agreeable to disposition. Will close encounter.

## 2022-06-21 ENCOUNTER — Other Ambulatory Visit (HOSPITAL_COMMUNITY)
Admission: RE | Admit: 2022-06-21 | Discharge: 2022-06-21 | Disposition: A | Discharge status: Home / Self Care | Payer: BC Managed Care – PPO | Source: Ambulatory Visit | Attending: Obstetrics & Gynecology | Admitting: Obstetrics & Gynecology

## 2022-06-21 ENCOUNTER — Ambulatory Visit: Payer: BC Managed Care – PPO | Admitting: Obstetrics & Gynecology

## 2022-06-21 ENCOUNTER — Encounter: Payer: Self-pay | Admitting: Obstetrics & Gynecology

## 2022-06-21 VITALS — BP 126/84 | HR 89

## 2022-06-21 DIAGNOSIS — N870 Mild cervical dysplasia: Secondary | ICD-10-CM

## 2022-06-21 NOTE — Progress Notes (Signed)
    Angela Joseph 1985/03/22 [id number]        37 y.o.  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  HPV HR Neg 10/11/21.   OB History  Gravida Para Term Preterm AB Living  0 0 0 0 0 0  SAB IAB Ectopic Multiple Live Births  0 0 0 0 0    Past medical history,surgical history, problem list, medications, allergies, family history and social history were all reviewed and documented in the EPIC chart.   Directed ROS with pertinent positives and negatives documented in the history of present illness/assessment and plan.  Exam:  Vitals:   06/21/22 1338  BP: 126/84  Pulse: 89  SpO2: 99%   General appearance:  Normal  Gynecologic exam: Vulva normal.  Speculum:  Cervix/Vagina normal.  Pap reflex done.  Colpo/patho 11/12/21: FINAL MICROSCOPIC DIAGNOSIS:   A. CERVIX, 3 O'CLOCK, BIOPSY:  - Low-grade squamous intraepithelial lesion (CIN1, low grade dysplasia)   B. CERVIX, 6 O'CLOCK, BIOPSY:  - Benign cervical glandular mucosa  - Negative for dysplasia or malignancy   C. CERVIX, 9 O'CLOCK, BIOPSY:  - Low-grade squamous intraepithelial lesion (CIN1, low grade dysplasia)    Assessment/Plan:  37 y.o. G0P0000   1. 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  HPV HR Neg 10/11/21.  Pap reflex done today.  Management per results. - [facility]   Angela Del MD, 1:46 PM 06/21/2022

## 2022-06-26 LAB — CYTOLOGY - PAP: Diagnosis: NEGATIVE

## 2022-10-07 DIAGNOSIS — M9902 Segmental and somatic dysfunction of thoracic region: Secondary | ICD-10-CM | POA: Diagnosis not present

## 2022-10-07 DIAGNOSIS — M9901 Segmental and somatic dysfunction of cervical region: Secondary | ICD-10-CM | POA: Diagnosis not present

## 2022-10-07 DIAGNOSIS — M9905 Segmental and somatic dysfunction of pelvic region: Secondary | ICD-10-CM | POA: Diagnosis not present

## 2022-10-07 DIAGNOSIS — M9903 Segmental and somatic dysfunction of lumbar region: Secondary | ICD-10-CM | POA: Diagnosis not present

## 2022-10-07 DIAGNOSIS — M9906 Segmental and somatic dysfunction of lower extremity: Secondary | ICD-10-CM | POA: Diagnosis not present

## 2022-10-21 DIAGNOSIS — M9902 Segmental and somatic dysfunction of thoracic region: Secondary | ICD-10-CM | POA: Diagnosis not present

## 2022-10-21 DIAGNOSIS — M9901 Segmental and somatic dysfunction of cervical region: Secondary | ICD-10-CM | POA: Diagnosis not present

## 2022-10-21 DIAGNOSIS — M9903 Segmental and somatic dysfunction of lumbar region: Secondary | ICD-10-CM | POA: Diagnosis not present

## 2022-10-21 DIAGNOSIS — M9905 Segmental and somatic dysfunction of pelvic region: Secondary | ICD-10-CM | POA: Diagnosis not present

## 2022-10-25 DIAGNOSIS — M9901 Segmental and somatic dysfunction of cervical region: Secondary | ICD-10-CM | POA: Diagnosis not present

## 2022-10-25 DIAGNOSIS — M9903 Segmental and somatic dysfunction of lumbar region: Secondary | ICD-10-CM | POA: Diagnosis not present

## 2022-10-25 DIAGNOSIS — M9905 Segmental and somatic dysfunction of pelvic region: Secondary | ICD-10-CM | POA: Diagnosis not present

## 2022-10-25 DIAGNOSIS — M9902 Segmental and somatic dysfunction of thoracic region: Secondary | ICD-10-CM | POA: Diagnosis not present

## 2022-11-04 DIAGNOSIS — M9901 Segmental and somatic dysfunction of cervical region: Secondary | ICD-10-CM | POA: Diagnosis not present

## 2022-11-04 DIAGNOSIS — M9903 Segmental and somatic dysfunction of lumbar region: Secondary | ICD-10-CM | POA: Diagnosis not present

## 2022-11-04 DIAGNOSIS — M9902 Segmental and somatic dysfunction of thoracic region: Secondary | ICD-10-CM | POA: Diagnosis not present

## 2022-11-04 DIAGNOSIS — M9905 Segmental and somatic dysfunction of pelvic region: Secondary | ICD-10-CM | POA: Diagnosis not present

## 2022-11-13 DIAGNOSIS — M9903 Segmental and somatic dysfunction of lumbar region: Secondary | ICD-10-CM | POA: Diagnosis not present

## 2022-11-13 DIAGNOSIS — M9902 Segmental and somatic dysfunction of thoracic region: Secondary | ICD-10-CM | POA: Diagnosis not present

## 2022-11-13 DIAGNOSIS — M9901 Segmental and somatic dysfunction of cervical region: Secondary | ICD-10-CM | POA: Diagnosis not present

## 2022-11-13 DIAGNOSIS — M9905 Segmental and somatic dysfunction of pelvic region: Secondary | ICD-10-CM | POA: Diagnosis not present

## 2022-12-30 DIAGNOSIS — M9903 Segmental and somatic dysfunction of lumbar region: Secondary | ICD-10-CM | POA: Diagnosis not present

## 2022-12-30 DIAGNOSIS — M9905 Segmental and somatic dysfunction of pelvic region: Secondary | ICD-10-CM | POA: Diagnosis not present

## 2022-12-30 DIAGNOSIS — M9901 Segmental and somatic dysfunction of cervical region: Secondary | ICD-10-CM | POA: Diagnosis not present

## 2022-12-30 DIAGNOSIS — M9902 Segmental and somatic dysfunction of thoracic region: Secondary | ICD-10-CM | POA: Diagnosis not present

## 2023-02-06 DIAGNOSIS — M9903 Segmental and somatic dysfunction of lumbar region: Secondary | ICD-10-CM | POA: Diagnosis not present

## 2023-02-06 DIAGNOSIS — M9905 Segmental and somatic dysfunction of pelvic region: Secondary | ICD-10-CM | POA: Diagnosis not present

## 2023-02-06 DIAGNOSIS — M9902 Segmental and somatic dysfunction of thoracic region: Secondary | ICD-10-CM | POA: Diagnosis not present

## 2023-02-06 DIAGNOSIS — M9901 Segmental and somatic dysfunction of cervical region: Secondary | ICD-10-CM | POA: Diagnosis not present

## 2023-03-06 DIAGNOSIS — M9905 Segmental and somatic dysfunction of pelvic region: Secondary | ICD-10-CM | POA: Diagnosis not present

## 2023-03-06 DIAGNOSIS — M9902 Segmental and somatic dysfunction of thoracic region: Secondary | ICD-10-CM | POA: Diagnosis not present

## 2023-03-06 DIAGNOSIS — M9903 Segmental and somatic dysfunction of lumbar region: Secondary | ICD-10-CM | POA: Diagnosis not present

## 2023-03-06 DIAGNOSIS — M9901 Segmental and somatic dysfunction of cervical region: Secondary | ICD-10-CM | POA: Diagnosis not present

## 2023-04-03 DIAGNOSIS — M9903 Segmental and somatic dysfunction of lumbar region: Secondary | ICD-10-CM | POA: Diagnosis not present

## 2023-04-03 DIAGNOSIS — M9905 Segmental and somatic dysfunction of pelvic region: Secondary | ICD-10-CM | POA: Diagnosis not present

## 2023-04-03 DIAGNOSIS — M9901 Segmental and somatic dysfunction of cervical region: Secondary | ICD-10-CM | POA: Diagnosis not present

## 2023-04-03 DIAGNOSIS — M9902 Segmental and somatic dysfunction of thoracic region: Secondary | ICD-10-CM | POA: Diagnosis not present

## 2023-07-15 DIAGNOSIS — M6240 Contracture of muscle, unspecified site: Secondary | ICD-10-CM | POA: Diagnosis not present

## 2023-07-15 DIAGNOSIS — M9901 Segmental and somatic dysfunction of cervical region: Secondary | ICD-10-CM | POA: Diagnosis not present

## 2023-07-15 DIAGNOSIS — R519 Headache, unspecified: Secondary | ICD-10-CM | POA: Diagnosis not present

## 2023-07-15 DIAGNOSIS — M9905 Segmental and somatic dysfunction of pelvic region: Secondary | ICD-10-CM | POA: Diagnosis not present

## 2023-07-15 DIAGNOSIS — M9903 Segmental and somatic dysfunction of lumbar region: Secondary | ICD-10-CM | POA: Diagnosis not present

## 2023-07-15 DIAGNOSIS — M9902 Segmental and somatic dysfunction of thoracic region: Secondary | ICD-10-CM | POA: Diagnosis not present

## 2023-07-18 DIAGNOSIS — M9903 Segmental and somatic dysfunction of lumbar region: Secondary | ICD-10-CM | POA: Diagnosis not present

## 2023-07-18 DIAGNOSIS — R519 Headache, unspecified: Secondary | ICD-10-CM | POA: Diagnosis not present

## 2023-07-18 DIAGNOSIS — M9902 Segmental and somatic dysfunction of thoracic region: Secondary | ICD-10-CM | POA: Diagnosis not present

## 2023-07-18 DIAGNOSIS — M9905 Segmental and somatic dysfunction of pelvic region: Secondary | ICD-10-CM | POA: Diagnosis not present

## 2023-07-18 DIAGNOSIS — M9901 Segmental and somatic dysfunction of cervical region: Secondary | ICD-10-CM | POA: Diagnosis not present

## 2023-07-18 DIAGNOSIS — M6240 Contracture of muscle, unspecified site: Secondary | ICD-10-CM | POA: Diagnosis not present

## 2023-07-24 DIAGNOSIS — R519 Headache, unspecified: Secondary | ICD-10-CM | POA: Diagnosis not present

## 2023-07-24 DIAGNOSIS — M9901 Segmental and somatic dysfunction of cervical region: Secondary | ICD-10-CM | POA: Diagnosis not present

## 2023-07-24 DIAGNOSIS — M6240 Contracture of muscle, unspecified site: Secondary | ICD-10-CM | POA: Diagnosis not present

## 2023-07-24 DIAGNOSIS — M9905 Segmental and somatic dysfunction of pelvic region: Secondary | ICD-10-CM | POA: Diagnosis not present

## 2023-07-24 DIAGNOSIS — M9903 Segmental and somatic dysfunction of lumbar region: Secondary | ICD-10-CM | POA: Diagnosis not present

## 2023-07-24 DIAGNOSIS — M9902 Segmental and somatic dysfunction of thoracic region: Secondary | ICD-10-CM | POA: Diagnosis not present

## 2023-07-24 DIAGNOSIS — M9906 Segmental and somatic dysfunction of lower extremity: Secondary | ICD-10-CM | POA: Diagnosis not present

## 2023-07-31 DIAGNOSIS — M9902 Segmental and somatic dysfunction of thoracic region: Secondary | ICD-10-CM | POA: Diagnosis not present

## 2023-07-31 DIAGNOSIS — M9905 Segmental and somatic dysfunction of pelvic region: Secondary | ICD-10-CM | POA: Diagnosis not present

## 2023-07-31 DIAGNOSIS — M9901 Segmental and somatic dysfunction of cervical region: Secondary | ICD-10-CM | POA: Diagnosis not present

## 2023-07-31 DIAGNOSIS — R519 Headache, unspecified: Secondary | ICD-10-CM | POA: Diagnosis not present

## 2023-07-31 DIAGNOSIS — M9903 Segmental and somatic dysfunction of lumbar region: Secondary | ICD-10-CM | POA: Diagnosis not present

## 2023-07-31 DIAGNOSIS — M6240 Contracture of muscle, unspecified site: Secondary | ICD-10-CM | POA: Diagnosis not present

## 2023-08-08 DIAGNOSIS — M9903 Segmental and somatic dysfunction of lumbar region: Secondary | ICD-10-CM | POA: Diagnosis not present

## 2023-08-08 DIAGNOSIS — M9902 Segmental and somatic dysfunction of thoracic region: Secondary | ICD-10-CM | POA: Diagnosis not present

## 2023-08-08 DIAGNOSIS — M9905 Segmental and somatic dysfunction of pelvic region: Secondary | ICD-10-CM | POA: Diagnosis not present

## 2023-08-08 DIAGNOSIS — M6240 Contracture of muscle, unspecified site: Secondary | ICD-10-CM | POA: Diagnosis not present

## 2023-08-08 DIAGNOSIS — R519 Headache, unspecified: Secondary | ICD-10-CM | POA: Diagnosis not present

## 2023-08-08 DIAGNOSIS — M9901 Segmental and somatic dysfunction of cervical region: Secondary | ICD-10-CM | POA: Diagnosis not present

## 2023-08-15 DIAGNOSIS — M9901 Segmental and somatic dysfunction of cervical region: Secondary | ICD-10-CM | POA: Diagnosis not present

## 2023-08-15 DIAGNOSIS — M9903 Segmental and somatic dysfunction of lumbar region: Secondary | ICD-10-CM | POA: Diagnosis not present

## 2023-08-15 DIAGNOSIS — M6240 Contracture of muscle, unspecified site: Secondary | ICD-10-CM | POA: Diagnosis not present

## 2023-08-15 DIAGNOSIS — R519 Headache, unspecified: Secondary | ICD-10-CM | POA: Diagnosis not present

## 2023-08-15 DIAGNOSIS — M9902 Segmental and somatic dysfunction of thoracic region: Secondary | ICD-10-CM | POA: Diagnosis not present

## 2023-08-15 DIAGNOSIS — M9905 Segmental and somatic dysfunction of pelvic region: Secondary | ICD-10-CM | POA: Diagnosis not present

## 2023-08-28 DIAGNOSIS — M9905 Segmental and somatic dysfunction of pelvic region: Secondary | ICD-10-CM | POA: Diagnosis not present

## 2023-08-28 DIAGNOSIS — R519 Headache, unspecified: Secondary | ICD-10-CM | POA: Diagnosis not present

## 2023-08-28 DIAGNOSIS — M9903 Segmental and somatic dysfunction of lumbar region: Secondary | ICD-10-CM | POA: Diagnosis not present

## 2023-08-28 DIAGNOSIS — M6240 Contracture of muscle, unspecified site: Secondary | ICD-10-CM | POA: Diagnosis not present

## 2023-08-28 DIAGNOSIS — M9902 Segmental and somatic dysfunction of thoracic region: Secondary | ICD-10-CM | POA: Diagnosis not present

## 2023-08-28 DIAGNOSIS — M9901 Segmental and somatic dysfunction of cervical region: Secondary | ICD-10-CM | POA: Diagnosis not present

## 2023-09-15 DIAGNOSIS — M6240 Contracture of muscle, unspecified site: Secondary | ICD-10-CM | POA: Diagnosis not present

## 2023-09-15 DIAGNOSIS — M9902 Segmental and somatic dysfunction of thoracic region: Secondary | ICD-10-CM | POA: Diagnosis not present

## 2023-09-15 DIAGNOSIS — M9901 Segmental and somatic dysfunction of cervical region: Secondary | ICD-10-CM | POA: Diagnosis not present

## 2023-09-15 DIAGNOSIS — M9905 Segmental and somatic dysfunction of pelvic region: Secondary | ICD-10-CM | POA: Diagnosis not present

## 2023-09-15 DIAGNOSIS — M9903 Segmental and somatic dysfunction of lumbar region: Secondary | ICD-10-CM | POA: Diagnosis not present

## 2023-09-15 DIAGNOSIS — R519 Headache, unspecified: Secondary | ICD-10-CM | POA: Diagnosis not present

## 2023-10-06 DIAGNOSIS — M9905 Segmental and somatic dysfunction of pelvic region: Secondary | ICD-10-CM | POA: Diagnosis not present

## 2023-10-06 DIAGNOSIS — M6240 Contracture of muscle, unspecified site: Secondary | ICD-10-CM | POA: Diagnosis not present

## 2023-10-06 DIAGNOSIS — R519 Headache, unspecified: Secondary | ICD-10-CM | POA: Diagnosis not present

## 2023-10-06 DIAGNOSIS — M9902 Segmental and somatic dysfunction of thoracic region: Secondary | ICD-10-CM | POA: Diagnosis not present

## 2023-10-06 DIAGNOSIS — M9901 Segmental and somatic dysfunction of cervical region: Secondary | ICD-10-CM | POA: Diagnosis not present

## 2023-10-06 DIAGNOSIS — M9903 Segmental and somatic dysfunction of lumbar region: Secondary | ICD-10-CM | POA: Diagnosis not present

## 2023-10-28 DIAGNOSIS — M9901 Segmental and somatic dysfunction of cervical region: Secondary | ICD-10-CM | POA: Diagnosis not present

## 2023-10-28 DIAGNOSIS — M6240 Contracture of muscle, unspecified site: Secondary | ICD-10-CM | POA: Diagnosis not present

## 2023-10-28 DIAGNOSIS — R519 Headache, unspecified: Secondary | ICD-10-CM | POA: Diagnosis not present

## 2023-10-28 DIAGNOSIS — M9902 Segmental and somatic dysfunction of thoracic region: Secondary | ICD-10-CM | POA: Diagnosis not present

## 2023-10-28 DIAGNOSIS — M9905 Segmental and somatic dysfunction of pelvic region: Secondary | ICD-10-CM | POA: Diagnosis not present

## 2023-10-28 DIAGNOSIS — M9903 Segmental and somatic dysfunction of lumbar region: Secondary | ICD-10-CM | POA: Diagnosis not present

## 2023-11-17 DIAGNOSIS — M9902 Segmental and somatic dysfunction of thoracic region: Secondary | ICD-10-CM | POA: Diagnosis not present

## 2023-11-17 DIAGNOSIS — M9905 Segmental and somatic dysfunction of pelvic region: Secondary | ICD-10-CM | POA: Diagnosis not present

## 2023-11-17 DIAGNOSIS — R519 Headache, unspecified: Secondary | ICD-10-CM | POA: Diagnosis not present

## 2023-11-17 DIAGNOSIS — M6240 Contracture of muscle, unspecified site: Secondary | ICD-10-CM | POA: Diagnosis not present

## 2023-11-17 DIAGNOSIS — M9903 Segmental and somatic dysfunction of lumbar region: Secondary | ICD-10-CM | POA: Diagnosis not present

## 2023-11-17 DIAGNOSIS — M9901 Segmental and somatic dysfunction of cervical region: Secondary | ICD-10-CM | POA: Diagnosis not present

## 2023-12-08 DIAGNOSIS — M9901 Segmental and somatic dysfunction of cervical region: Secondary | ICD-10-CM | POA: Diagnosis not present

## 2023-12-08 DIAGNOSIS — R519 Headache, unspecified: Secondary | ICD-10-CM | POA: Diagnosis not present

## 2023-12-08 DIAGNOSIS — M9903 Segmental and somatic dysfunction of lumbar region: Secondary | ICD-10-CM | POA: Diagnosis not present

## 2023-12-08 DIAGNOSIS — M9902 Segmental and somatic dysfunction of thoracic region: Secondary | ICD-10-CM | POA: Diagnosis not present

## 2023-12-08 DIAGNOSIS — M9905 Segmental and somatic dysfunction of pelvic region: Secondary | ICD-10-CM | POA: Diagnosis not present

## 2023-12-08 DIAGNOSIS — M6240 Contracture of muscle, unspecified site: Secondary | ICD-10-CM | POA: Diagnosis not present

## 2023-12-18 DIAGNOSIS — M9901 Segmental and somatic dysfunction of cervical region: Secondary | ICD-10-CM | POA: Diagnosis not present

## 2023-12-18 DIAGNOSIS — M9902 Segmental and somatic dysfunction of thoracic region: Secondary | ICD-10-CM | POA: Diagnosis not present

## 2023-12-18 DIAGNOSIS — M6240 Contracture of muscle, unspecified site: Secondary | ICD-10-CM | POA: Diagnosis not present

## 2023-12-18 DIAGNOSIS — M9905 Segmental and somatic dysfunction of pelvic region: Secondary | ICD-10-CM | POA: Diagnosis not present

## 2023-12-18 DIAGNOSIS — R519 Headache, unspecified: Secondary | ICD-10-CM | POA: Diagnosis not present

## 2023-12-18 DIAGNOSIS — M9903 Segmental and somatic dysfunction of lumbar region: Secondary | ICD-10-CM | POA: Diagnosis not present

## 2024-01-01 DIAGNOSIS — M9902 Segmental and somatic dysfunction of thoracic region: Secondary | ICD-10-CM | POA: Diagnosis not present

## 2024-01-01 DIAGNOSIS — M9901 Segmental and somatic dysfunction of cervical region: Secondary | ICD-10-CM | POA: Diagnosis not present

## 2024-01-01 DIAGNOSIS — R519 Headache, unspecified: Secondary | ICD-10-CM | POA: Diagnosis not present

## 2024-01-01 DIAGNOSIS — M9903 Segmental and somatic dysfunction of lumbar region: Secondary | ICD-10-CM | POA: Diagnosis not present

## 2024-01-19 DIAGNOSIS — M9902 Segmental and somatic dysfunction of thoracic region: Secondary | ICD-10-CM | POA: Diagnosis not present

## 2024-01-19 DIAGNOSIS — M9906 Segmental and somatic dysfunction of lower extremity: Secondary | ICD-10-CM | POA: Diagnosis not present

## 2024-01-19 DIAGNOSIS — M9903 Segmental and somatic dysfunction of lumbar region: Secondary | ICD-10-CM | POA: Diagnosis not present

## 2024-01-19 DIAGNOSIS — M9901 Segmental and somatic dysfunction of cervical region: Secondary | ICD-10-CM | POA: Diagnosis not present

## 2024-02-09 DIAGNOSIS — M9902 Segmental and somatic dysfunction of thoracic region: Secondary | ICD-10-CM | POA: Diagnosis not present

## 2024-02-09 DIAGNOSIS — M9903 Segmental and somatic dysfunction of lumbar region: Secondary | ICD-10-CM | POA: Diagnosis not present

## 2024-02-09 DIAGNOSIS — M9906 Segmental and somatic dysfunction of lower extremity: Secondary | ICD-10-CM | POA: Diagnosis not present

## 2024-02-09 DIAGNOSIS — M9901 Segmental and somatic dysfunction of cervical region: Secondary | ICD-10-CM | POA: Diagnosis not present
